# Patient Record
Sex: Male | Born: 1960 | Race: Black or African American | Hispanic: No
Health system: Midwestern US, Community
[De-identification: ages and names within clinical notes are randomized; demographics above are authoritative.]

## PROBLEM LIST (undated history)

## (undated) DIAGNOSIS — R0602 Shortness of breath: Secondary | ICD-10-CM

## (undated) DIAGNOSIS — R42 Dizziness and giddiness: Principal | ICD-10-CM

## (undated) DIAGNOSIS — M549 Dorsalgia, unspecified: Secondary | ICD-10-CM

## (undated) DIAGNOSIS — F329 Major depressive disorder, single episode, unspecified: Secondary | ICD-10-CM

## (undated) DIAGNOSIS — M25561 Pain in right knee: Secondary | ICD-10-CM

## (undated) DIAGNOSIS — G8929 Other chronic pain: Secondary | ICD-10-CM

## (undated) DIAGNOSIS — F32A Depression, unspecified: Secondary | ICD-10-CM

## (undated) DIAGNOSIS — F419 Anxiety disorder, unspecified: Secondary | ICD-10-CM

## (undated) DIAGNOSIS — F319 Bipolar disorder, unspecified: Secondary | ICD-10-CM

## (undated) HISTORY — PX: ANKLE SURGERY: SHX546

## (undated) HISTORY — PX: KNEE SURGERY: SHX244

---

## 2007-01-26 ENCOUNTER — Ambulatory Visit: Payer: Self-pay | Admitting: Family Medicine

## 2007-01-27 ENCOUNTER — Ambulatory Visit (HOSPITAL_COMMUNITY): Admission: RE | Admit: 2007-01-27 | Discharge: 2007-01-27 | Payer: Self-pay | Admitting: Family Medicine

## 2007-06-01 ENCOUNTER — Emergency Department (HOSPITAL_COMMUNITY): Admission: EM | Admit: 2007-06-01 | Discharge: 2007-06-01 | Payer: Self-pay | Admitting: Emergency Medicine

## 2007-06-01 ENCOUNTER — Encounter (INDEPENDENT_AMBULATORY_CARE_PROVIDER_SITE_OTHER): Payer: Self-pay | Admitting: Emergency Medicine

## 2007-06-01 ENCOUNTER — Ambulatory Visit: Payer: Self-pay | Admitting: Vascular Surgery

## 2007-07-21 ENCOUNTER — Ambulatory Visit: Payer: Self-pay | Admitting: Family Medicine

## 2007-07-21 LAB — CONVERTED CEMR LAB: GC Probe Amp, Urine: POSITIVE — AB

## 2010-07-05 ENCOUNTER — Other Ambulatory Visit: Payer: Self-pay | Admitting: Emergency Medicine

## 2010-07-06 ENCOUNTER — Inpatient Hospital Stay (HOSPITAL_COMMUNITY): Admission: AD | Admit: 2010-07-06 | Discharge: 2010-07-10 | Payer: Self-pay | Admitting: Psychiatry

## 2010-07-06 ENCOUNTER — Ambulatory Visit: Payer: Self-pay | Admitting: Psychiatry

## 2010-11-28 LAB — DIFFERENTIAL
Basophils Relative: 0 % (ref 0–1)
Lymphs Abs: 2 10*3/uL (ref 0.7–4.0)
Monocytes Absolute: 0.5 10*3/uL (ref 0.1–1.0)
Monocytes Relative: 7 % (ref 3–12)
Neutro Abs: 4.8 10*3/uL (ref 1.7–7.7)
Neutrophils Relative %: 65 % (ref 43–77)

## 2010-11-28 LAB — BASIC METABOLIC PANEL
BUN: 13 mg/dL (ref 6–23)
Chloride: 106 mEq/L (ref 96–112)
Creatinine, Ser: 1.08 mg/dL (ref 0.4–1.5)

## 2010-11-28 LAB — CBC
MCH: 32.4 pg (ref 26.0–34.0)
MCV: 93.5 fL (ref 78.0–100.0)
RDW: 14 % (ref 11.5–15.5)
WBC: 7.4 10*3/uL (ref 4.0–10.5)

## 2010-11-28 LAB — ETHANOL: Alcohol, Ethyl (B): <5 mg/dL (ref 0–10)

## 2010-11-28 LAB — RAPID URINE DRUG SCREEN, HOSP PERFORMED
Amphetamines: NOT DETECTED
Benzodiazepines: NOT DETECTED
Cocaine: NOT DETECTED

## 2011-06-27 LAB — CBC
HCT: 41.5
Hemoglobin: 14.1
MCHC: 34
MCV: 92.5
RBC: 4.49
RDW: 12.8

## 2011-06-27 LAB — DIFFERENTIAL
Basophils Absolute: 0
Basophils Relative: 0
Eosinophils Absolute: 0.1
Eosinophils Relative: 1
Monocytes Absolute: 0.6
Monocytes Relative: 10

## 2011-06-27 LAB — I-STAT 8, (EC8 V) (CONVERTED LAB)
BUN: 8
Bicarbonate: 26.1 — ABNORMAL HIGH
Glucose, Bld: 99
HCT: 42
Hemoglobin: 14.3
Operator id: 285841
Potassium: 3.9
Sodium: 140

## 2011-06-27 LAB — APTT: aPTT: 24

## 2015-08-25 ENCOUNTER — Emergency Department (HOSPITAL_COMMUNITY): Payer: Medicare Other

## 2015-08-25 ENCOUNTER — Emergency Department (HOSPITAL_COMMUNITY)
Admission: EM | Admit: 2015-08-25 | Discharge: 2015-08-25 | Disposition: A | Discharge status: Home / Self Care | Payer: Medicare Other | Attending: Emergency Medicine | Admitting: Emergency Medicine

## 2015-08-25 ENCOUNTER — Encounter (HOSPITAL_COMMUNITY): Payer: Self-pay | Admitting: *Deleted

## 2015-08-25 DIAGNOSIS — G8929 Other chronic pain: Secondary | ICD-10-CM | POA: Diagnosis not present

## 2015-08-25 DIAGNOSIS — Y9301 Activity, walking, marching and hiking: Secondary | ICD-10-CM | POA: Diagnosis not present

## 2015-08-25 DIAGNOSIS — Y9289 Other specified places as the place of occurrence of the external cause: Secondary | ICD-10-CM | POA: Insufficient documentation

## 2015-08-25 DIAGNOSIS — S79912A Unspecified injury of left hip, initial encounter: Secondary | ICD-10-CM | POA: Diagnosis present

## 2015-08-25 DIAGNOSIS — Y998 Other external cause status: Secondary | ICD-10-CM | POA: Insufficient documentation

## 2015-08-25 DIAGNOSIS — W010XXA Fall on same level from slipping, tripping and stumbling without subsequent striking against object, initial encounter: Secondary | ICD-10-CM | POA: Diagnosis not present

## 2015-08-25 HISTORY — DX: Other chronic pain: G89.29

## 2015-08-25 HISTORY — DX: Dorsalgia, unspecified: M54.9

## 2015-08-25 MED ORDER — ACETAMINOPHEN-CODEINE #3 300-30 MG PO TABS: 1.0000 | ORAL_TABLET | Freq: Four times a day (QID) | ORAL | Status: DC | PRN

## 2015-08-25 NOTE — ED Provider Notes (Signed)
CSN: [id number]     Arrival date & time 08/25/15  1153 History  By signing my name below, I, Lyndel SafeKaitlyn Shelton, attest that this documentation has been prepared under the direction and in the presence of Fayrene HelperBowie Tison Leibold, PA-C.  Electronically Signed: Lyndel SafeKaitlyn Shelton, ED Scribe. 08/25/2015. 12:29 PM.   Chief Complaint  Patient presents with  . Fall  . Hip Pain   The history is provided by the patient. No language interpreter was used.   HPI Comments: Luke Roberts is a 54 y.o. male, with a PMhx of chronic back pain, who presents to the Emergency Department complaining of sudden onset, constant, non-radiating, sharp, 8/10 left hip pain s/p mechanical fall that occurred 2 hours ago. He reports he was walking in soft grass when his cane became stuck in a hole and he fell onto his left hip. He was able to ambulate after the fall. Pt ambulates with a cane at baseline due to chronic back pain and denies worsening or new back pain s/p fall. He has not taken any alleviating medication PTA. Denies injury to left knee or left ankle, numbness or weakness. No overlying skin changes.   Past Medical History  Diagnosis Date  . Chronic back pain    History reviewed. No pertinent past surgical history. History reviewed. No pertinent family history. Social History  Substance Use Topics  . Smoking status: Unknown If Ever Smoked  . Smokeless tobacco: None  . Alcohol Use: No    Review of Systems  Musculoskeletal: Positive for arthralgias ( left hip). Negative for gait problem.  Skin: Negative for color change and wound.  Neurological: Negative for weakness and numbness.   Allergies  Review of patient's allergies indicates no known allergies.  Home Medications   Prior to Admission medications   Not on File   BP 138/96 mmHg  Pulse 95  Temp(Src) 98.1 F (36.7 C) (Oral)  Resp 18  SpO2 96% Physical Exam  Constitutional: He is oriented to person, place, and time. He appears well-developed and  well-nourished. No distress.  HENT:  Head: Normocephalic.  Eyes: Conjunctivae are normal.  Neck: Normal range of motion. Neck supple.  Cardiovascular: Normal rate.   Pulmonary/Chest: Effort normal. No respiratory distress.  Musculoskeletal: Normal range of motion. He exhibits tenderness.  Left hip; TTP noted to lateral hip at greater trochanter, no crepitus or step-offs, normal hip flexion and extension, increase in pain with hip abduction and adduction; strength and sensation intact; NVI.   Neurological: He is alert and oriented to person, place, and time. Coordination normal.  Skin: Skin is warm.  Psychiatric: He has a normal mood and affect. His behavior is normal.  Nursing note and vitals reviewed.   ED Course  Procedures  DIAGNOSTIC STUDIES: Oxygen Saturation is 96% on RA, normal by my interpretation.    COORDINATION OF CARE: 12:27 PM Discussed treatment plan with pt which includes to order Xray of left hip, although my suspicion for fracture is low. Pt acknowledges and agrees to plan.   Imaging Review Dg Hip Unilat With Pelvis 2-3 Views Left  08/25/2015  CLINICAL DATA:  Fall today. Left hip injury and pain. Initial encounter. EXAM: DG HIP (WITH OR WITHOUT PELVIS) 2-3V LEFT COMPARISON:  None. FINDINGS: No evidence of acute hip fracture or dislocation. Severe left hip osteoarthritis is seen, with prominent subchondral geode formation and flattening of the articular surface of the left femoral head. Mild to moderate right hip osteoarthritis noted. No pelvic fracture identified. IMPRESSION: No acute findings.  Severe left hip osteoarthritis. Electronically Signed   By: Myles Rosenthal M.D.   On: 08/25/2015 13:18   I have personally reviewed and evaluated these images as part of my medical decision-making.   MDM   Final diagnoses:  Hip injury, left, initial encounter    BP 138/96 mmHg  Pulse 95  Temp(Src) 98.1 F (36.7 C) (Oral)  Resp 18  SpO2 96%   I personally performed the  services described in this documentation, which was scribed in my presence. The recorded information has been reviewed and is accurate.      Fayrene Helper, PA-C 08/25/15 1325  Leta Baptist, MD 09/01/15 [phone]

## 2015-08-25 NOTE — ED Notes (Signed)
Pt reports walking with a cane due to chronic back pain. Pt tripped and fell, now has left hip pain. Pt ambulatory at triage.

## 2015-08-25 NOTE — Discharge Instructions (Signed)

## 2015-09-10 ENCOUNTER — Encounter (HOSPITAL_COMMUNITY): Payer: Self-pay | Admitting: Vascular Surgery

## 2015-09-10 ENCOUNTER — Emergency Department (HOSPITAL_COMMUNITY)
Admission: EM | Admit: 2015-09-10 | Discharge: 2015-09-11 | Disposition: A | Discharge status: Other Acute Inpatient Hospital | Payer: Medicare Other | Attending: Emergency Medicine | Admitting: Emergency Medicine

## 2015-09-10 DIAGNOSIS — G8929 Other chronic pain: Secondary | ICD-10-CM | POA: Insufficient documentation

## 2015-09-10 DIAGNOSIS — R45851 Suicidal ideations: Secondary | ICD-10-CM

## 2015-09-10 DIAGNOSIS — F32A Depression, unspecified: Secondary | ICD-10-CM

## 2015-09-10 DIAGNOSIS — F329 Major depressive disorder, single episode, unspecified: Secondary | ICD-10-CM | POA: Insufficient documentation

## 2015-09-10 HISTORY — DX: Bipolar disorder, unspecified: F31.9

## 2015-09-10 LAB — COMPREHENSIVE METABOLIC PANEL
ALBUMIN: 3.5 g/dL (ref 3.5–5.0)
ALK PHOS: 76 U/L (ref 38–126)
ALT: 19 U/L (ref 17–63)
AST: 24 U/L (ref 15–41)
Anion gap: 10 (ref 5–15)
BUN: 12 mg/dL (ref 6–20)
CALCIUM: 9.1 mg/dL (ref 8.9–10.3)
CHLORIDE: 107 mmol/L (ref 101–111)
CO2: 23 mmol/L (ref 22–32)
CREATININE: 0.76 mg/dL (ref 0.61–1.24)
GFR calc non Af Amer: >60 mL/min (ref >60)
GLUCOSE: 94 mg/dL (ref 65–99)
Potassium: 3.6 mmol/L (ref 3.5–5.1)
SODIUM: 140 mmol/L (ref 135–145)
Total Bilirubin: 0.5 mg/dL (ref 0.3–1.2)
Total Protein: 7.3 g/dL (ref 6.5–8.1)

## 2015-09-10 LAB — RAPID URINE DRUG SCREEN, HOSP PERFORMED
AMPHETAMINES: NOT DETECTED
BARBITURATES: NOT DETECTED
Benzodiazepines: NOT DETECTED
Cocaine: NOT DETECTED
OPIATES: NOT DETECTED
TETRAHYDROCANNABINOL: NOT DETECTED

## 2015-09-10 LAB — CBC
HEMATOCRIT: 37.2 % — AB (ref 39.0–52.0)
HEMOGLOBIN: 12.3 g/dL — AB (ref 13.0–17.0)
MCH: 30.2 pg (ref 26.0–34.0)
MCHC: 33.1 g/dL (ref 30.0–36.0)
MCV: 91.4 fL (ref 78.0–100.0)
Platelets: 329 10*3/uL (ref 150–400)
RBC: 4.07 MIL/uL — AB (ref 4.22–5.81)
RDW: 12.1 % (ref 11.5–15.5)
WBC: 5.6 10*3/uL (ref 4.0–10.5)

## 2015-09-10 LAB — SALICYLATE LEVEL: Salicylate Lvl: <4 mg/dL (ref 2.8–30.0)

## 2015-09-10 LAB — ETHANOL: Alcohol, Ethyl (B): 32 mg/dL — ABNORMAL HIGH (ref <5)

## 2015-09-10 LAB — ACETAMINOPHEN LEVEL: Acetaminophen (Tylenol), Serum: <10 ug/mL — ABNORMAL LOW (ref 10–30)

## 2015-09-10 NOTE — ED Notes (Signed)
Pt reports to the ED for eval of SI/HI. His plan would be to jump off of a bridge or beat someone to death with his cane. Pt reports he has been out of his medication x 3 weeks because he cannot get it filled. Pt reports these thoughts began today. Reports he is having command hallucinations and visual hallucinations. Pt denies any substance abuse. Pt A&Ox4, resp e/u, and skin warm and dry.

## 2015-09-10 NOTE — ED Notes (Signed)
Called staffing for sitter 

## 2015-09-11 ENCOUNTER — Inpatient Hospital Stay (HOSPITAL_COMMUNITY)
Admission: AD | Admit: 2015-09-11 | Discharge: 2015-09-15 | DRG: 885 | Disposition: A | Discharge status: Home / Self Care | Payer: Medicare Other | Source: Intra-hospital | Attending: Psychiatry | Admitting: Psychiatry

## 2015-09-11 ENCOUNTER — Encounter (HOSPITAL_COMMUNITY): Payer: Self-pay | Admitting: *Deleted

## 2015-09-11 DIAGNOSIS — F319 Bipolar disorder, unspecified: Secondary | ICD-10-CM | POA: Diagnosis present

## 2015-09-11 DIAGNOSIS — F314 Bipolar disorder, current episode depressed, severe, without psychotic features: Secondary | ICD-10-CM | POA: Diagnosis not present

## 2015-09-11 DIAGNOSIS — Z23 Encounter for immunization: Secondary | ICD-10-CM | POA: Insufficient documentation

## 2015-09-11 DIAGNOSIS — F17203 Nicotine dependence unspecified, with withdrawal: Secondary | ICD-10-CM | POA: Diagnosis present

## 2015-09-11 DIAGNOSIS — R45851 Suicidal ideations: Secondary | ICD-10-CM | POA: Diagnosis present

## 2015-09-11 DIAGNOSIS — F329 Major depressive disorder, single episode, unspecified: Secondary | ICD-10-CM | POA: Diagnosis not present

## 2015-09-11 HISTORY — DX: Major depressive disorder, single episode, unspecified: F32.9

## 2015-09-11 HISTORY — DX: Anxiety disorder, unspecified: F41.9

## 2015-09-11 HISTORY — DX: Depression, unspecified: F32.A

## 2015-09-11 MED ORDER — HYDROXYZINE HCL 25 MG PO TABS
25.0000 mg | ORAL_TABLET | Freq: Four times a day (QID) | ORAL | Status: DC | PRN
  Administered 2015-09-11 – 2015-09-14 (×3): 25 mg via ORAL
  Filled 2015-09-11 (×4): qty 1

## 2015-09-11 MED ORDER — NICOTINE 21 MG/24HR TD PT24
21.0000 mg | MEDICATED_PATCH | Freq: Every day | TRANSDERMAL | Status: DC
  Administered 2015-09-11 – 2015-09-13 (×3): 21 mg via TRANSDERMAL
  Filled 2015-09-11 (×7): qty 1

## 2015-09-11 MED ORDER — ACETAMINOPHEN 325 MG PO TABS
650.0000 mg | ORAL_TABLET | Freq: Four times a day (QID) | ORAL | Status: DC | PRN
  Administered 2015-09-11 – 2015-09-12 (×2): 650 mg via ORAL
  Filled 2015-09-11 (×2): qty 2

## 2015-09-11 MED ORDER — QUETIAPINE FUMARATE 100 MG PO TABS
100.0000 mg | ORAL_TABLET | Freq: Every day | ORAL | Status: DC
  Administered 2015-09-11 – 2015-09-14 (×4): 100 mg via ORAL
  Filled 2015-09-11 (×7): qty 1

## 2015-09-11 MED ORDER — PNEUMOCOCCAL VAC POLYVALENT 25 MCG/0.5ML IJ INJ
0.5000 mL | INJECTION | INTRAMUSCULAR | Status: AC
  Administered 2015-09-12: 0.5 mL via INTRAMUSCULAR

## 2015-09-11 MED ORDER — ALUM & MAG HYDROXIDE-SIMETH 200-200-20 MG/5ML PO SUSP: 30.0000 mL | ORAL | Status: DC | PRN

## 2015-09-11 MED ORDER — MAGNESIUM HYDROXIDE 400 MG/5ML PO SUSP: 30.0000 mL | Freq: Every day | ORAL | Status: DC | PRN

## 2015-09-11 MED ORDER — DIVALPROEX SODIUM ER 500 MG PO TB24
500.0000 mg | ORAL_TABLET | Freq: Every day | ORAL | Status: DC
  Administered 2015-09-11 – 2015-09-13 (×3): 500 mg via ORAL
  Filled 2015-09-11 (×6): qty 1

## 2015-09-11 NOTE — ED Notes (Signed)
MD at bedside. 

## 2015-09-11 NOTE — H&P (Signed)
Psychiatric Admission Assessment Adult  Patient Identification: Luke Roberts MRN:  [id number] Date of Evaluation:  09/11/2015 Chief Complaint:   " I felt life was not worth living anymore " Principal Diagnosis: Bipolar 1 disorder, depressed, severe (Luke) Diagnosis:   Patient Active Problem List   Diagnosis Date Noted  . Bipolar 1 disorder, depressed, severe ([name]) [F31.4] 09/11/2015   History of Present Illness: 54 year old man.  Reports he has been diagnosed with Bipolar Disorder in the past . States he has not been taking his prescribed medications for several weeks " because I could not afford them and I could not get anybody to help me ". States he became increasingly frustrated because he was " going to all the places which they say will help you, and I was just getting the run around, they don't really help".  States he started feeling more depressed over recent days to weeks, made worse by holiday season, which has intensified his sense of loneliness . He reports he felt " as if life was not worth living anymore".  He states he was thinking of jumping off a bridge . He also developed vague HI towards " whoever is running [employer] ", which he attributes to the frustration he felt about not being helped . States a passerby stopped and asked him if he was OK and on finding out patient had some suicidal ideations, brought him to hospital ( yesterday)   Associated Signs/Symptoms: Depression Symptoms:  depressed mood, anhedonia, insomnia, recurrent thoughts of death, suicidal thoughts with specific plan, loss of energy/fatigue, decreased appetite,  Has lost 10 lbs.  (Hypo) Manic Symptoms:  At this time does not endorse symptoms of mania or hypomania . Anxiety Symptoms:  Some free floating anxiety endorsed  Psychotic Symptoms:  States he was having fleeting hallucinations recently, states " I felt I could hear the voice of a deceased friend talk to me" recently. At this time does  not endorse current psychotic symptoms. PTSD Symptoms: Does not endorse  Total Time spent with patient: 45 minutes  Past Psychiatric History: Reports he has been diagnosed with Bipolar Disorder, and had been on Depakote ER and Seroquel up to a few weeks ago, when he ran out / could not afford refilling . Has had previous psychiatric admissions- x 3 in the past, most recent admission 5 years ago. States he has had prior hallucinations in the context of " being off my medicines ".  History of suicide attempt by cutting wrist 10 years ago. Describes some agoraphobia, avoids public or crowded places . Endorses periods of increased irritability, decreased need for sleep, but does not describe any clear periods of mania. States he had been getting psychiatric medications via her PCP, and also had been referred to [facility] in [place] to Self: Is patient at risk for suicide?: Yes (contracts for safety, stated thoughts have decreased) Risk to Others:   Prior Inpatient Therapy:   Prior Outpatient Therapy:    Alcohol Screening: 1. How often do you have a drink containing alcohol?: 2 to 3 times a week 2. How many drinks containing alcohol do you have on a typical day when you are drinking?: 3 or 4 3. How often do you have six or more drinks on one occasion?: Never Preliminary Score: 1 4. How often during the last year have you found that you were not able to stop drinking once you had started?: Never 5. How often during the last year have  you failed to do what was normally expected from you becasue of drinking?: Never 6. How often during the last year have you needed a first drink in the morning to get yourself going after a heavy drinking session?: Never 7. How often during the last year have you had a feeling of guilt of remorse after drinking?: Never 8. How often during the last year have you been unable to remember what happened the night before because you had been drinking?: Never 9. Have you  or someone else been injured as a result of your drinking?: No 10. Has a relative or friend or a doctor or another health worker been concerned about your drinking or suggested you cut down?: No Alcohol Use Disorder Identification Test Final Score (AUDIT): 4 Brief Intervention: Yes Substance Abuse History in the last 12 months:  Denies drug abuse, denies alcohol abuse, states he drinks about 2-3 beers once a week.  Consequences of Substance Abuse: Denies  Previous Psychotropic Medications:  Depakote ER and Seroquel  For 6-7 years " on and off, but mostly on".  States " those are the two that work for me ". Also remembers having been on Wellbutrin .   Psychological Evaluations: No  Past Medical History: denies any medical issues, uses chewing tobacco, denies smoking, NKDA. Past Medical History  Diagnosis Date  . Chronic back pain   . Bipolar 1 disorder ([name])   . Anxiety   . Depression     Past Surgical History  Procedure Laterality Date  . Knee surgery Left   . Ankle surgery Right    Family History: parents are both deceased, father died from MI, 30 + years ago, mother died from 45, Nov 17, 2002, one brother died 56 years ago from MI.  No surviving siblings. Family Psychiatric  History:  Denies any psychiatric family history, no suicides in family  Social History: currently homeless , divorced, no children, on disability,  recently released from jail for " misdemeanor larceny".   History  Alcohol Use  . 1.2 oz/week  . 2 Cans of beer per week    Comment: stated he usually drinks couple beers weekly     History  Drug Use No    Comment: denied all drug use    Social History   Social History  . Marital Status: Single    Spouse Name: N/A  . Number of Children: N/A  . Years of Education: N/A   Social History Main Topics  . Smoking status: Never Smoker   . Smokeless tobacco: Current User    Types: Snuff  . Alcohol Use: 1.2 oz/week    2 Cans of beer per week     Comment: stated  he usually drinks couple beers weekly  . Drug Use: No     Comment: denied all drug use  . Sexual Activity: No   Other Topics Concern  . None   Social History Narrative   Additional Social History:    Pain Medications: tylenol Prescriptions: no prescription medication Over the Counter: tylenol History of alcohol / drug use?: Yes Longest period of sobriety (when/how long): week Negative Consequences of Use: Financial, Work / School Withdrawal Symptoms: Other (Comment) (anxiety)  Allergies:  No Known Allergies Lab Results:  Results for orders placed or performed during the hospital encounter of 09/10/15 (from the past 48 hour(s))  Comprehensive metabolic panel     Status: None   Collection Time: 09/10/15  9:41 PM  Result Value Ref Range   Sodium 140  135 - 145 mmol/L   Potassium 3.6 3.5 - 5.1 mmol/L   Chloride 107 101 - 111 mmol/L   CO2 23 22 - 32 mmol/L   Glucose, Bld 94 65 - 99 mg/dL   BUN 12 6 - 20 mg/dL   Creatinine, Ser 0.76 0.61 - 1.24 mg/dL   Calcium 9.1 8.9 - 10.3 mg/dL   Total Protein 7.3 6.5 - 8.1 g/dL   Albumin 3.5 3.5 - 5.0 g/dL   AST 24 15 - 41 U/L   ALT 19 17 - 63 U/L   Alkaline Phosphatase 76 38 - 126 U/L   Total Bilirubin 0.5 0.3 - 1.2 mg/dL   GFR calc non Af Amer >60 >60 mL/min   GFR calc Af Amer >60 >60 mL/min    Comment: (NOTE) The eGFR has been calculated using the CKD EPI equation. This calculation has not been validated in all clinical situations. eGFR's persistently <60 mL/min signify possible Chronic Kidney Disease.    Anion gap 10 5 - 15  Ethanol (ETOH)     Status: Abnormal   Collection Time: 09/10/15  9:41 PM  Result Value Ref Range   Alcohol, Ethyl (B) 32 (H) <5 mg/dL    Comment:        LOWEST DETECTABLE LIMIT FOR SERUM ALCOHOL IS 5 mg/dL FOR MEDICAL PURPOSES ONLY   Salicylate level     Status: None   Collection Time: 09/10/15  9:41 PM  Result Value Ref Range   Salicylate Lvl <3.2 2.8 - 30.0 mg/dL  Acetaminophen level     Status:  Abnormal   Collection Time: 09/10/15  9:41 PM  Result Value Ref Range   Acetaminophen (Tylenol), Serum <10 (L) 10 - 30 ug/mL    Comment:        THERAPEUTIC CONCENTRATIONS VARY SIGNIFICANTLY. A RANGE OF 10-30 ug/mL MAY BE AN EFFECTIVE CONCENTRATION FOR MANY PATIENTS. HOWEVER, SOME ARE BEST TREATED AT CONCENTRATIONS OUTSIDE THIS RANGE. ACETAMINOPHEN CONCENTRATIONS >150 ug/mL AT 4 HOURS AFTER INGESTION AND >50 ug/mL AT 12 HOURS AFTER INGESTION ARE OFTEN ASSOCIATED WITH TOXIC REACTIONS.   CBC     Status: Abnormal   Collection Time: 09/10/15  9:41 PM  Result Value Ref Range   WBC 5.6 4.0 - 10.5 K/uL   RBC 4.07 (L) 4.22 - 5.81 MIL/uL   Hemoglobin 12.3 (L) 13.0 - 17.0 g/dL   HCT 37.2 (L) 39.0 - 52.0 %   MCV 91.4 78.0 - 100.0 fL   MCH 30.2 26.0 - 34.0 pg   MCHC 33.1 30.0 - 36.0 g/dL   RDW 12.1 11.5 - 15.5 %   Platelets 329 150 - 400 K/uL  Urine rapid drug screen (hosp performed) (Not at [facility])     Status: None   Collection Time: 09/10/15  9:43 PM  Result Value Ref Range   Opiates NONE DETECTED NONE DETECTED   Cocaine NONE DETECTED NONE DETECTED   Benzodiazepines NONE DETECTED NONE DETECTED   Amphetamines NONE DETECTED NONE DETECTED   Tetrahydrocannabinol NONE DETECTED NONE DETECTED   Barbiturates NONE DETECTED NONE DETECTED    Comment:        DRUG SCREEN FOR MEDICAL PURPOSES ONLY.  IF CONFIRMATION IS NEEDED FOR ANY PURPOSE, NOTIFY LAB WITHIN 5 DAYS.        LOWEST DETECTABLE LIMITS FOR URINE DRUG SCREEN Drug Class       Cutoff (ng/mL) Amphetamine      1000 Barbiturate      200 Benzodiazepine   122 Tricyclics  300 Opiates          300 Cocaine          300 THC              50     Metabolic Disorder Labs:  No results found for: HGBA1C, MPG No results found for: PROLACTIN No results found for: CHOL, TRIG, HDL, CHOLHDL, VLDL, LDLCALC  Current Medications: Current Facility-Administered Medications  Medication Dose Route Frequency Provider Last Rate Last Dose  .  acetaminophen (TYLENOL) tablet 650 mg  650 mg Oral Q6H PRN Harriet Butte, NP      . alum & mag hydroxide-simeth (MAALOX/MYLANTA) 200-200-20 MG/5ML suspension 30 mL  30 mL Oral Q4H PRN Harriet Butte, NP      . hydrOXYzine (ATARAX/VISTARIL) tablet 25 mg  25 mg Oral Q6H PRN Harriet Butte, NP      . magnesium hydroxide (MILK OF MAGNESIA) suspension 30 mL  30 mL Oral Daily PRN Harriet Butte, NP      . nicotine (NICODERM CQ - dosed in mg/24 hours) patch 21 mg  21 mg Transdermal Daily Jenne Campus, MD      . Derrill Memo ON 09/12/2015] pneumococcal 23 valent vaccine (PNU-IMMUNE) injection 0.5 mL  0.5 mL Intramuscular Tomorrow-1000 Jenne Campus, MD       PTA Medications: Prescriptions prior to admission  Medication Sig Dispense Refill Last Dose  . acetaminophen-codeine (TYLENOL #3) 300-30 MG tablet Take 1-2 tablets by mouth every 6 (six) hours as needed for moderate pain. 15 tablet 0     Musculoskeletal: Strength & Muscle Tone: within normal limits- no tremors, no diaphoresis, no symptoms of alcohol WDL .  Gait & Station: normal- walks with cane due to bilateral knee and L hip pain Patient leans: N/A  Psychiatric Specialty Exam: Physical Exam  Review of Systems  Constitutional: Positive for weight loss.  HENT: Negative.   Respiratory: Negative.   Cardiovascular: Negative.   Gastrointestinal: Negative.   Genitourinary: Negative.   Musculoskeletal: Positive for joint pain.       Knee pain, bilateral Walks with cane     Skin: Negative.   Neurological: Negative for seizures.  Endo/Heme/Allergies: Negative.   Psychiatric/Behavioral: Positive for depression.    Blood pressure 116/69, pulse 78, temperature 98.6 F (37 C), temperature source Oral, resp. rate 18, height 6' 2"  (1.88 m), weight 198 lb (89.812 kg), SpO2 97 %.Body mass index is 25.41 kg/(m^2).  General Appearance: Fairly Groomed  Engineer, water::  Fair  Speech:  Normal Rate  Volume:  Normal  Mood:  Depressed  Affect:   Constricted  Thought Process:  Goal Directed and Linear  Orientation:  Full (Time, Place, and Person)  Thought Content:  reports occasional auditory hallucinations, none today, does not appear internally preoccupied, no delusions expressed   Suicidal Thoughts:  at this time denies any suicidal ideations, self injurious ideations and contracts for safety on unit   Homicidal Thoughts:  States he has had some HI towards [employer]- does not know who this is , but today states " I really have no intention of doing anything"  Memory:  recent and remote grossly intact  Judgement:  Fair  Insight:  Fair  Psychomotor Activity:  Normal- no tremors, no diaphoresis.  Concentration:  Good  Recall:  Good  Fund of Knowledge:Good  Language: Good  Akathisia:  Negative  Handed:  Right  AIMS (if indicated):     Assets:  Communication Skills Desire for  Improvement Resilience  ADL's:  Intact  Cognition: WNL  Sleep:        Treatment Plan Summary: Daily contact with patient to assess and evaluate symptoms and progress in treatment, Medication management, Plan inpatient admission and medications as below   Observation Level/Precautions:  15 minute checks  Laboratory:  HgbA1C, Lipid Panel, TSH   Psychotherapy:  Milieu, support    Medications:  Restart Depakote ER 500 mgrs QHS, restart Seroquel 100 mgrs QHS . Vistaril PRNs as needed for anxiety  Consultations:  As needed   Discharge Concerns:  Homelessness   Estimated LOS: 5-6 days   Other:     I certify that inpatient services furnished can reasonably be expected to improve the patient's condition.   COBOS, FERNANDO 12/26/20162:28 PM

## 2015-09-11 NOTE — ED Notes (Signed)
TTS in process 

## 2015-09-11 NOTE — Plan of Care (Signed)
Problem: Consults Goal: Depression Patient Education See Patient Education Module for education specifics.  Outcome: Progressing Nurse discussed depression/coping skills with patient.        

## 2015-09-11 NOTE — Progress Notes (Signed)
D: Pt SI-no plan but contracts for safety. denies HI/AVH. Pt is pleasant and cooperative. Pt feels the same, since he came in today. Pt optimistic about getting a place to live and help with getting his prescriptions filled.   A: Pt was offered support and encouragement. Pt was given scheduled medications. Pt was encourage to attend groups. Q 15 minute checks were done for safety.   R:Pt attends groups and interacts well with peers and staff. Pt is taking medication. Pt has no complaints at this time .Pt receptive to treatment and safety maintained on unit.

## 2015-09-11 NOTE — Tx Team (Signed)
Initial Interdisciplinary Treatment Plan   PATIENT STRESSORS: Financial difficulties Health problems Marital or family conflict Substance abuse   PATIENT STRENGTHS: Ability for insight Average or above average intelligence Capable of independent living Communication skills General fund of knowledge Motivation for treatment/growth Supportive family/friends   PROBLEM LIST: Problem List/Patient Goals Date to be addressed Date deferred Reason deferred Estimated date of resolution  anxiety 09/11/2015   D/c  depression 09/11/2015   D/c  Suicidal/homicidal thoughts 09/11/2015   D/c  Substance abuse 09/11/2015   D/c                                 DISCHARGE CRITERIA:  Ability to meet basic life and health needs Adequate post-discharge living arrangements Improved stabilization in mood, thinking, and/or behavior Medical problems require only outpatient monitoring Motivation to continue treatment in a less acute level of care Need for constant or close observation no longer present Reduction of life-threatening or endangering symptoms to within safe limits Safe-care adequate arrangements made Verbal commitment to aftercare and medication compliance Withdrawal symptoms are absent or subacute and managed without 24-hour nursing intervention  PRELIMINARY DISCHARGE PLAN: Attend aftercare/continuing care group Attend PHP/IOP Attend 12-step recovery group Outpatient therapy Return to previous living arrangement  PATIENT/FAMIILY INVOLVEMENT: This treatment plan has been presented to and reviewed with the patient, Luke Roberts.  The patient and family have been given the opportunity to ask questions and make suggestions.  Quintella ReichertKnight, Coyle Stordahl [place] 09/11/2015, 2:04 PM
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
## 2015-09-11 NOTE — Progress Notes (Signed)
Adult Psychoeducational Group Note  Date:  09/11/2015 Time:  11:17 PM  Group Topic/Focus:  Wrap-Up Group:   The focus of this group is to help patients review their daily goal of treatment and discuss progress on daily workbooks.  Participation Level:  Active  Participation Quality:  Appropriate and Attentive  Affect:  Appropriate  Cognitive:  Alert, Appropriate and Oriented  Insight: Appropriate  Engagement in Group:  Engaged  Modes of Intervention:  Discussion and Education  Additional Comments:  Pt attended and participated in group.  Pt stated that he just got here today but has had a good day since arriving.    Berlin Hunuttle, Yasmin Bronaugh M 09/11/2015, 11:17 PM

## 2015-09-11 NOTE — ED Provider Notes (Signed)
Pt alert, content, nad.  Filed Vitals:   09/11/15 0617 09/11/15 0906  BP: 139/90 126/78  Pulse: 71 78  Temp: 97.9 F (36.6 C) 98.6 F (37 C)  Resp: 18 18    [facility], Dr Jama Flavorsobos, accepting doctor.  Pt currently appears stable for transfer.     Cathren LaineKevin Murline Weigel, MD 09/11/15 [id number]

## 2015-09-11 NOTE — Discharge Instructions (Signed)
Transfer to [facility] 

## 2015-09-11 NOTE — ED Provider Notes (Signed)
CSN: [id number]     Arrival date & time 09/10/15  2128 History  By signing my name below, I, Luke Roberts, attest that this documentation has been prepared under the direction and in the presence of Tomasita CrumbleAdeleke Baila Rouse, MD. Electronically Signed: Bethel BornBritney Roberts, ED Scribe. 09/11/2015. 1:24 AM    Chief Complaint  Patient presents with  . Suicidal  . Homicidal   The history is provided by the patient. No language interpreter was used.   Luke Roberts is a 54 y.o. male with history of bipolar disorder who presents to the Emergency Department complaining of SI/HI with onset yesterday. He reports a plan to jump off a bridge. Pt states that his symptoms worsened last night near 7 PM. He has been out of his medication for 3 weeks. Pt states that he has been on his medication for 6 years and it usually alleviates his symptoms. Associated symptoms include hallucinations, he states that he was having a conversation with a friend who is deceased earlier. Pt denies fever, runny nose, abdominal pain, nausea, vomiting, diarrhea, and chest pain.   Past Medical History  Diagnosis Date  . Chronic back pain   . Bipolar 1 disorder [facility](HCC)    Past Surgical History  Procedure Laterality Date  . Knee surgery Left   . Ankle surgery Right    No family history on file. Social History  Substance Use Topics  . Smoking status: Never Smoker   . Smokeless tobacco: Current User  . Alcohol Use: Yes     Comment: drank a little today, no drinking previously    Review of Systems 10 Systems reviewed and all are negative for acute change except as noted in the HPI. Allergies  Review of patient's allergies indicates no known allergies.  Home Medications   Prior to Admission medications   Medication Sig Start Date End Date Taking? Authorizing Provider  acetaminophen-codeine (TYLENOL #3) 300-30 MG tablet Take 1-2 tablets by mouth every 6 (six) hours as needed for moderate pain. 08/25/15   Fayrene HelperBowie Tran, PA-C   BP 142/88  mmHg  Pulse 85  Temp(Src) 97.8 F (36.6 C) (Oral)  Resp 16  SpO2 94% Physical Exam  Constitutional: He is oriented to person, place, and time. Vital signs are normal. He appears well-developed and well-nourished.  Non-toxic appearance. He does not appear ill. No distress.  HENT:  Head: Normocephalic and atraumatic.  Nose: Nose normal.  Mouth/Throat: Oropharynx is clear and moist. No oropharyngeal exudate.  Eyes: Conjunctivae and EOM are normal. Pupils are equal, round, and reactive to light. No scleral icterus.  Neck: Normal range of motion. Neck supple. No tracheal deviation, no edema, no erythema and normal range of motion present. No thyroid mass and no thyromegaly present.  Cardiovascular: Normal rate, regular rhythm, S1 normal, S2 normal, normal heart sounds, intact distal pulses and normal pulses.  Exam reveals no gallop and no friction rub.   No murmur heard. Pulmonary/Chest: Effort normal and breath sounds normal. No respiratory distress. He has no wheezes. He has no rhonchi. He has no rales.  Abdominal: Soft. Normal appearance and bowel sounds are normal. He exhibits no distension, no ascites and no mass. There is no hepatosplenomegaly. There is no tenderness. There is no rebound, no guarding and no CVA tenderness.  Musculoskeletal: Normal range of motion. He exhibits no edema or tenderness.  Lymphadenopathy:    He has no cervical adenopathy.  Neurological: He is alert and oriented to person, place, and time. He has normal strength. No  cranial nerve deficit or sensory deficit.  Skin: Skin is warm, dry and intact. No petechiae and no rash noted. He is not diaphoretic. No erythema. No pallor.  Psychiatric: He has a normal mood and affect. His behavior is normal. Judgment normal. He expresses suicidal ideation.  Nursing note and vitals reviewed.   ED Course  Procedures (including critical care time) DIAGNOSTIC STUDIES: Oxygen Saturation is 94% on RA, adequate by my interpretation.     COORDINATION OF CARE: 1:20 AM Discussed treatment plan which includes lab work and TTS consult with pt at bedside and pt agreed to plan.  Labs Review Labs Reviewed  ETHANOL - Abnormal; Notable for the following:    Alcohol, Ethyl (B) 32 (*)    All other components within normal limits  ACETAMINOPHEN LEVEL - Abnormal; Notable for the following:    Acetaminophen (Tylenol), Serum <10 (*)    All other components within normal limits  CBC - Abnormal; Notable for the following:    RBC 4.07 (*)    Hemoglobin 12.3 (*)    HCT 37.2 (*)    All other components within normal limits  COMPREHENSIVE METABOLIC PANEL  SALICYLATE LEVEL  URINE RAPID DRUG SCREEN, HOSP PERFORMED    Imaging Review No results found. I have personally reviewed and evaluated these lab results as part of my medical decision-making.   EKG Interpretation None      MDM   Final diagnoses:  None   Patient presents to the ED for SI.  He is medically clear after history and exam. Will consult TTS for evaluation.   I personally performed the services described in this documentation, which was scribed in my presence. The recorded information has been reviewed and is accurate.      Tomasita Crumble, MD 09/11/15 [phone]

## 2015-09-11 NOTE — ED Notes (Signed)
[facility]

## 2015-09-11 NOTE — [facility] Suicide Risk Assessment (Signed)
[facility]   Nursing information obtained from:  Patient Demographic factors:  Male, Divorced or widowed, Low socioeconomic status, Living alone, Unemployed Current Mental Status:  Suicidal ideation indicated by patient, Thoughts of violence towards others Loss Factors:  Financial problems / change in socioeconomic status Historical Factors:  NA Risk Reduction Factors:  Living with another person, especially a relative Total Time spent with patient: 45 minutes Principal Problem: Bipolar 1 disorder, depressed, severe (HCC) Diagnosis:   Patient Active Problem List   Diagnosis Date Noted  . Bipolar 1 disorder, depressed, severe (HCC) [F31.4] 09/11/2015     Continued Clinical Symptoms:  Alcohol Use Disorder Identification Test Final Score (AUDIT): 4 The "Alcohol Use Disorders Identification Test", Guidelines for Use in Primary Care, Second Edition.  World Science writerHealth Organization [facility](WHO). Score between 0-7:  no or low risk or alcohol related problems. Score between 8-15:  moderate risk of alcohol related problems. Score between 16-19:  high risk of alcohol related problems. Score 20 or above:  warrants further diagnostic evaluation for alcohol dependence and treatment.   CLINICAL FACTORS:  54 year old male, reports history of bipolar disorder, presents with depression, in the context of medication non compliance x several weeks . States Seroquel/Depakote ER combination has worked best for him.    Psychiatric Specialty Exam: Physical Exam  ROS  Blood pressure 116/69, pulse 78, temperature 98.6 F (37 C), temperature source Oral, resp. rate 18, height 6\' 2"  (1.88 m), weight 198 lb (89.812 kg), SpO2 97 %.Body mass index is 25.41 kg/(m^2).   see admit note MSE                                                        COGNITIVE FEATURES THAT CONTRIBUTE TO RISK:  Closed-mindedness and Loss of executive function    SUICIDE RISK:   Moderate:   Frequent suicidal ideation with limited intensity, and duration, some specificity in terms of plans, no associated intent, good self-control, limited dysphoria/symptomatology, some risk factors present, and identifiable protective factors, including available and accessible social support.  PLAN OF CARE: Patient will be admitted to inpatient psychiatric unit for stabilization and safety. Will provide and encourage milieu participation. Provide medication management and maked adjustments as needed.  Will follow daily.    Medical Decision Making:  Review of Psycho-Social Stressors (1), Review or order clinical lab tests (1), Established Problem, Worsening (2) and Review of New Medication or Change in Dosage (2)  I certify that inpatient services furnished can reasonably be expected to improve the patient's condition.   Bernise Sylvain 09/11/2015, 3:03 PM

## 2015-09-11 NOTE — Plan of Care (Signed)
Problem: Ineffective individual coping Goal: STG: Patient will remain free from self harm Outcome: Progressing Pt safe on the unit at this time     

## 2015-09-11 NOTE — ED Notes (Signed)
[place] here awaiting EMTELA.
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
## 2015-09-11 NOTE — ED Provider Notes (Signed)
9:15 AM patient is alert ambulatory, offers no complaint. Pleasant cooperative. Walks with limp favoring right leg which she states is chronic for approximately the past 5 years. Results for orders placed or performed during the hospital encounter of 09/10/15  Comprehensive metabolic panel  Result Value Ref Range   Sodium 140 135 - 145 mmol/L   Potassium 3.6 3.5 - 5.1 mmol/L   Chloride 107 101 - 111 mmol/L   CO2 23 22 - 32 mmol/L   Glucose, Bld 94 65 - 99 mg/dL   BUN 12 6 - 20 mg/dL   Creatinine, Ser 2.870.76 0.61 - 1.24 mg/dL   Calcium 9.1 8.9 - 86.710.3 mg/dL   Total Protein 7.3 6.5 - 8.1 g/dL   Albumin 3.5 3.5 - 5.0 g/dL   AST 24 15 - 41 U/L   ALT 19 17 - 63 U/L   Alkaline Phosphatase 76 38 - 126 U/L   Total Bilirubin 0.5 0.3 - 1.2 mg/dL   GFR calc non Af Amer >60 >60 mL/min   GFR calc Af Amer >60 >60 mL/min   Anion gap 10 5 - 15  Ethanol (ETOH)  Result Value Ref Range   Alcohol, Ethyl (B) 32 (H) <5 mg/dL  Salicylate level  Result Value Ref Range   Salicylate Lvl <4.0 2.8 - 30.0 mg/dL  Acetaminophen level  Result Value Ref Range   Acetaminophen (Tylenol), Serum <10 (L) 10 - 30 ug/mL  CBC  Result Value Ref Range   WBC 5.6 4.0 - 10.5 K/uL   RBC 4.07 (L) 4.22 - 5.81 MIL/uL   Hemoglobin 12.3 (L) 13.0 - 17.0 g/dL   HCT 67.237.2 (L) 09.439.0 - 70.952.0 %   MCV 91.4 78.0 - 100.0 fL   MCH 30.2 26.0 - 34.0 pg   MCHC 33.1 30.0 - 36.0 g/dL   RDW 62.812.1 36.611.5 - 29.415.5 %   Platelets 329 150 - 400 K/uL  Urine rapid drug screen (hosp performed) (Not at [facility])  Result Value Ref Range   Opiates NONE DETECTED NONE DETECTED   Cocaine NONE DETECTED NONE DETECTED   Benzodiazepines NONE DETECTED NONE DETECTED   Amphetamines NONE DETECTED NONE DETECTED   Tetrahydrocannabinol NONE DETECTED NONE DETECTED   Barbiturates NONE DETECTED NONE DETECTED   Dg Hip Unilat With Pelvis 2-3 Views Left  08/25/2015  CLINICAL DATA:  Fall today. Left hip injury and pain. Initial encounter. EXAM: DG HIP (WITH OR WITHOUT PELVIS)  2-3V LEFT COMPARISON:  None. FINDINGS: No evidence of acute hip fracture or dislocation. Severe left hip osteoarthritis is seen, with prominent subchondral geode formation and flattening of the articular surface of the left femoral head. Mild to moderate right hip osteoarthritis noted. No pelvic fracture identified. IMPRESSION: No acute findings.  Severe left hip osteoarthritis. Electronically Signed   By: Myles RosenthalJohn  Stahl M.D.   On: 08/25/2015 13:18   [facility] .   Doug SouSam Lataya Varnell, MD 09/11/15 [phone]

## 2015-09-12 DIAGNOSIS — F314 Bipolar disorder, current episode depressed, severe, without psychotic features: Secondary | ICD-10-CM | POA: Diagnosis not present

## 2015-09-12 LAB — TSH: TSH: 2.324 u[IU]/mL (ref 0.350–4.500)

## 2015-09-12 LAB — LIPID PANEL
CHOLESTEROL: 157 mg/dL (ref 0–200)
HDL: 64 mg/dL (ref >40)
LDL Cholesterol: 76 mg/dL (ref 0–99)
Total CHOL/HDL Ratio: 2.5 RATIO
Triglycerides: 87 mg/dL (ref <150)
VLDL: 17 mg/dL (ref 0–40)

## 2015-09-12 NOTE — Plan of Care (Signed)
Problem: Diagnosis: Increased Risk For Suicide Attempt Goal: STG-Patient Will Comply With Medication Regime Outcome: Progressing Luke Roberts is compliant with medication regimen

## 2015-09-12 NOTE — Progress Notes (Signed)
Patient ID: Luke Roberts, male   DOB: Nov 19, 1960, 54 y.o.   MRN: [id number] D: Patient stated he is doing well and tolerating medication. Pt reports decreased anxiety and depressive symptoms, rated depression as 6 from 10 on admission. Pt denies SI/HI/AVH. Pt thought process is organized and behavior is appropriate. Pt attended evening wrap up group and engaged in discussion.  Cooperative with assessment.   A: Met with pt 1:1. Medications administered as prescribed. Writer encouraged pt to discuss feelings. Pt encouraged to come to staff with any questions or concerns.   R: Patient is safe on the unit and compliant with medication.

## 2015-09-12 NOTE — Progress Notes (Signed)
Patient ID: Luke Roberts, male   DOB: 1961/01/31, 54 y.o.   MRN: [id number] Per State regulations 482.30 this chart was reviewed for medical necessity with respect to the patient's admission/duration of stay.    Next review date: 09/15/15  Thurman CoyerEric Janeshia Ciliberto, BSN, RN-BC  Case Manager

## 2015-09-12 NOTE — [facility] Group Notes (Signed)
[facility] Group Notes:  (Nursing/MHT/Case Management/Adjunct)  Date:  09/12/2015  Time:  11:11 AM  Type of Therapy:  Psychoeducational Skills  Participation Level:  Did Not Attend  Participation Quality:  N/A  Affect:  N/A   Cognitive:  N/A  Insight:  None  Engagement in Group:  None  Modes of Intervention:  Discussion and Education  Summary of Progress/Problems: Patient was invited to group but did not attend.   Tinsley Lomas E 09/12/2015, 11:11 AM 

## 2015-09-12 NOTE — Tx Team (Addendum)
Interdisciplinary Treatment Plan Update (Adult) Date: 09/12/2015   Date: 09/12/2015 1:13 PM  Progress in Treatment:  Attending groups: Yes Participating in groups: Yes Taking medication as prescribed: Yes  Tolerating medication: Yes  Family/Significant othe contact made: No, Pt declines Patient understands diagnosis: Yes AEB seeking help with depression Discussing patient identified problems/goals with staff: Yes  Medical problems stabilized or resolved: Yes  Denies suicidal/homicidal ideation: No, endorses passive SI Patient has not harmed self or Others: Yes   New problem(s) identified: None identified at this time.   Discharge Plan or Barriers: CSW will assess for appropriate discharge plan and relevant barriers.   Additional comments:  Patient and CSW reviewed pt's identified goals and treatment plan. Patient verbalized understanding and agreed to treatment plan. CSW reviewed [facility] "Discharge Process and Patient Involvement" Form. Pt verbalized understanding of information provided and signed form.   Reason for Continuation of Hospitalization:  Depression Medication stabilization Suicidal ideation  Estimated length of stay: 3-5 days  Review of initial/current patient goals per problem list:   1.  Goal(s): Patient will participate in aftercare plan  Met:  No  Target date: 3-5 days from date of admission   As evidenced by: Patient will participate within aftercare plan AEB aftercare provider and housing plan at discharge being identified.  09/12/15: CSW to work with Pt to assess for appropriate discharge plan and faciliate appointments and referrals as needed prior to d/c.   2.  Goal (s): Patient will exhibit decreased depressive symptoms and suicidal ideations.  Met:  No  Target date: 3-5 days from date of admission   As evidenced by: Patient will utilize self rating of depression at 3 or below and demonstrate decreased signs of depression or be deemed stable for  discharge by MD. 09/12/15: Pt was admitted with symptoms of depression, rating 10/10. Pt continues to present with flat affect and depressive symptoms.  Pt will demonstrate decreased symptoms of depression and rate depression at 3/10 or lower prior to discharge.  Attendees:  Patient:    Family:    Physician: Dr. Parke Poisson, MD  09/12/2015 1:13 PM  Nursing: Lars Pinks, RN Case manager  09/12/2015 1:13 PM  Clinical Social Worker Peri Maris, [place] 09/12/2015 1:13 PM  Other: Tilden Fossa, LCSWA 09/12/2015 1:13 PM  Clinical:  Gaylan Gerold, RN 09/12/2015 1:13 PM  Other: , RN Charge Nurse 09/12/2015 1:13 PM  Other: Hilda Lias, [place], [place] Social Work [phone]

## 2015-09-12 NOTE — Progress Notes (Signed)
[facility] MD Progress Note  09/12/2015 4:29 PM Luke Roberts  MRN:  [id number] Subjective:  Patient states he is " starting to feel a little better ". Denies medication side effects. Objective : I have discussed case with treatment team and have met with patient.  Patient reporting partial improvement of mood, although still feeling depressed . At this time he is denying any ongoing homicidal or violent ideations towards anyone, and specifically denies any HI towards [employer]. States " I just want to move on with my life and get better". Describes some chronic pain, due to which he uses a cane to assist with ambulation. Limited group participation - no disruptive behaviors on unit . Denies medication side effects at this time . Labs- TSH and Lipid panel WNL.   Principal Problem: Bipolar 1 disorder, depressed, severe ([place]) Diagnosis:   Patient Active Problem List   Diagnosis Date Noted  . Bipolar 1 disorder, depressed, severe ([place]) [F31.4] 09/11/2015   Total Time spent with patient: 20 minutes   Past Psychiatric History:    Past Medical History:  Past Medical History  Diagnosis Date  . Chronic back pain   . Bipolar 1 disorder ([place])   . Anxiety   . Depression     Past Surgical History  Procedure Laterality Date  . Knee surgery Left   . Ankle surgery Right    Family History: History reviewed. No pertinent family history.  Social History:  History  Alcohol Use  . 1.2 oz/week  . 2 Cans of beer per week    Comment: stated he usually drinks couple beers weekly     History  Drug Use No    Comment: denied all drug use    Social History   Social History  . Marital Status: Single    Spouse Name: N/A  . Number of Children: N/A  . Years of Education: N/A   Social History Main Topics  . Smoking status: Never Smoker   . Smokeless tobacco: Current User    Types: Snuff  . Alcohol Use: 1.2 oz/week    2 Cans of beer per week     Comment: stated he usually drinks  couple beers weekly  . Drug Use: No     Comment: denied all drug use  . Sexual Activity: No   Other Topics Concern  . 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 acetaminophen (TYLENOL) tablet 650 mg  650 mg Oral Q6H PRN Harriet Butte, NP   650 mg at 09/12/15 0826  . alum & mag hydroxide-simeth (MAALOX/MYLANTA) 200-200-20 MG/5ML suspension 30 mL  30 mL Oral Q4H PRN Harriet Butte, NP      . divalproex (DEPAKOTE ER) 24 hr tablet 500 mg  500 mg Oral QHS Myer Peer Sabena Winner, MD   500 mg at 09/11/15 2234  . hydrOXYzine (ATARAX/VISTARIL) tablet 25 mg  25 mg Oral Q6H PRN Harriet Butte, NP   25 mg at 09/11/15 2234  . magnesium hydroxide (MILK OF MAGNESIA) suspension 30 mL  30 mL Oral Daily PRN Harriet Butte, NP      .  nicotine (NICODERM CQ - dosed in mg/24 hours) patch 21 mg  21 mg Transdermal Daily Jenne Campus, MD   21 mg at 09/12/15 0826  . QUEtiapine (SEROQUEL) tablet 100 mg  100 mg Oral QHS Jenne Campus, MD   100 mg at 09/11/15 2234    Lab Results:  Results for orders placed or performed during the hospital encounter of 09/11/15 (from the past 48 hour(s))  Lipid panel     Status: None   Collection Time: 09/12/15  6:40 AM  Result Value Ref Range   Cholesterol 157 0 - 200 mg/dL   Triglycerides 87 <150 mg/dL   HDL 64 >40 mg/dL   Total CHOL/HDL Ratio 2.5 RATIO   VLDL 17 0 - 40 mg/dL   LDL Cholesterol 76 0 - 99 mg/dL    Comment:        Total Cholesterol/HDL:CHD Risk Coronary Heart Disease Risk Table                      Men   Women  1/2 Average Risk   3.4   3.3  Average Risk       5.0   4.4  2 X Average Risk   9.6   7.1  3 X Average Risk  23.4   11.0        Use the calculated Patient Ratio above and the CHD Risk Table to determine the patient's CHD Risk.        ATP III CLASSIFICATION (LDL):  <100     mg/dL   Optimal  100-129  mg/dL   Near or Above                    Optimal  130-159  mg/dL   Borderline  160-189  mg/dL   High  >190     mg/dL   Very High Performed at [facility]   TSH     Status: None   Collection Time: 09/12/15  6:40 AM  Result Value Ref Range   TSH 2.324 0.350 - 4.500 uIU/mL    Comment: Performed at [facility]    Physical Findings: AIMS: Facial and Oral Movements Muscles of Facial Expression: None, normal Lips and Perioral Area: None, normal Jaw: None, normal Tongue: None, normal,Extremity Movements Upper (arms, wrists, hands, fingers): None, normal Lower (legs, knees, ankles, toes): None, normal, Trunk Movements Neck, shoulders, hips: None, normal, Overall Severity Severity of abnormal movements (highest score from questions above): None, normal Incapacitation due to abnormal movements: None, normal Patient's awareness of abnormal movements (rate only patient's report): No Awareness, Dental Status Current problems with teeth and/or dentures?: Yes (needs dental work) Does patient usually wear dentures?: No  CIWA:  CIWA-Ar Total: 1 COWS:  COWS Total Score: 1  Musculoskeletal: Strength & Muscle Tone: within normal limits- walks with cane  Gait & Station: normal Patient leans: N/A  Psychiatric Specialty Exam: ROS denies chest pain or shortness of breath   Blood pressure 111/83, pulse 93, temperature 98.5 F (36.9 C), temperature source Oral, resp. rate 16, height _0  (1.88 m), weight 198 lb (89.812 kg), SpO2 97 %.Body mass index is 25.41 kg/(m^2).  General Appearance: Fairly Groomed  Engineer, water::  improved  Speech:  Normal  Rate  Volume:  Normal  Mood:  improving compared to admission  Affect:  Appropriate  Thought Process:  Linear  Orientation:  Other:  fully alert and attentive   Thought Content:  at this time denies hallucinations, no delusions, not internally preoccupied   Suicidal Thoughts:  No currently denies suicidal ideations and contracts for safety on unit   Homicidal Thoughts:  No as noted, today denies any homicidal or violent ideations towards anyone, and specifically also denies any violent or homicidal ideations towards [employer]:  recent and remote grossly intact   Judgement:  Fair  Insight:  Fair  Psychomotor Activity:  Decreased  Concentration:  Good  Recall:  Good  Fund of Knowledge:Good  Language: Good  Akathisia:  Negative  Handed:  Right  AIMS (if indicated):     Assets:  Communication Skills Desire for Improvement Resilience  ADL's: improved   Cognition: WNL  Sleep:  Number of Hours: 6.5  Assessment - patient improved compared to admission- less  Severely  depressed, not suicidal , and no longer endorsing homicidal or violent ideations. Tolerating medications well- as noted, states he has been stable on Depakote ER/Seroquel combination in the past .  Treatment Plan Summary: Daily contact with patient to assess and evaluate symptoms and progress in treatment, Medication management, Plan inpatient admission and medications as below   1. Continue to encourage milieu participation to work on coping skills and symptom reduction 2. Continue Depakote ER 500 mgrs QHS for mood disorder/bipolar disorder 3. Continue Seroquel 100 mgrs QHS for mood disorder/bipolar disorder  4. Continue Vistaril 25 mgrs Q 6 hours PRN for anxiety as needed 5. Treatment team working on disposition planning options.   Lavel Rieman 09/12/2015, 4:29 PM

## 2015-09-12 NOTE — [facility] Group Notes (Signed)
[facility] LCSW Group Therapy 09/12/2015 1:15 PM  Type of Therapy: Group Therapy- Feelings about Diagnosis  Pt did not attend, declined invitation.   Chad CordialLauren Carter, LCSWA 09/12/2015 4:23 PM

## 2015-09-12 NOTE — Plan of Care (Signed)
Problem: Diagnosis: Increased Risk For Suicide Attempt Goal: STG-Patient Will Attend All Groups On The Unit Outcome: Progressing Pt attended and engaged in evening wrap up group     

## 2015-09-12 NOTE — Progress Notes (Signed)
Adult Psychoeducational Group Note  Date:  09/12/2015 Time:  11:49 PM  Group Topic/Focus:  Wrap-Up Group:   The focus of this group is to help patients review their daily goal of treatment and discuss progress on daily workbooks.  Participation Level:  Active  Participation Quality:  Appropriate and Attentive  Affect:  Appropriate  Cognitive:  Alert, Appropriate and Oriented  Insight: Appropriate  Engagement in Group:  Engaged  Modes of Intervention:  Discussion and Education  Additional Comments:  Pt attended and participated in group.  Pt stated his goal today was to stay awake and pt reports that he was able to do this with the hopes of being able to sleep throughout the night.  Pt rated his day a 8/10 with 10 being the best.   Luke Roberts, Luke Roberts 09/12/2015, 11:49 PM

## 2015-09-12 NOTE — Progress Notes (Signed)
Patient ID: Luke Roberts, male   DOB: 09-Jul-1961, 54 y.o.   MRN: [id number]  DAR: Pt. Denies SI/HI and A/V Hallucinations. He reports sleep is fair, appetite is good, energy level is low, and concentration is poor. He rates depression, anxiety, and hopelessness 8/10. Patient does report pain in his left hip and received Tylenol however did not receive any relief. MD [place] notified about his pain. Support and encouragement provided to the patient. Scheduled medications administered to patient per physician's orders. Patient is cooperative but remains minimal and forwards little. His mood remains depressed and affect is flat. He is seen in the milieu but generally keeps to himself. Q15 minute checks are maintained for safety.

## 2015-09-13 LAB — HEMOGLOBIN A1C
HEMOGLOBIN A1C: 5.9 % — AB (ref 4.8–5.6)
Mean Plasma Glucose: 123 mg/dL

## 2015-09-13 NOTE — Progress Notes (Signed)
Patient ID: Luke Roberts, male   DOB: 06/29/1961, 54 y.o.   MRN: [id number] [facility] MD Progress Note  09/13/2015 7:12 PM Luke Roberts  MRN:  [id number] Subjective:  States he is " starting to feel a little better" and feels medications are " starting to kick in". He reports he is sleeping better . At this time he is planning on returning to [facility] when he discharges and trying to get a place to live once he gets his next check in early January. Denies medication side effects.  Objective : I have discussed case with treatment team and have met with patient.  Patient reports ongoing depression, but states he is better and at this time reports his depression as a " 5-6 over ten" Denies suicidal ideations at this time. He is more future oriented and is , as above, focusing more on disposition plans. He does report ongoing depression, low energy level , and some lingering pessimism, hopelessness about " things not getting better ". Has been attending groups. No disruptive behaviors on unit . HgbA1C, Lipid Panel, TSH WNL.   Principal Problem: Bipolar 1 disorder, depressed, severe ([place]) Diagnosis:   Patient Active Problem List   Diagnosis Date Noted  . Bipolar 1 disorder, depressed, severe ([place]) [F31.4] 09/11/2015   Total Time spent with patient: 20 minutes   Past Psychiatric History:    Past Medical History:  Past Medical History  Diagnosis Date  . Chronic back pain   . Bipolar 1 disorder ([place])   . Anxiety   . Depression     Past Surgical History  Procedure Laterality Date  . Knee surgery Left   . Ankle surgery Right    Family History: History reviewed. No pertinent family history.  Social History:  History  Alcohol Use  . 1.2 oz/week  . 2 Cans of beer per week    Comment: stated he usually drinks couple beers weekly     History  Drug Use No    Comment: denied all drug use    Social History   Social History  . Marital Status: Single    Spouse Name: N/A  . Number of  Children: N/A  . Years of Education: N/A   Social History Main Topics  . Smoking status: Never Smoker   . Smokeless tobacco: Current User    Types: Snuff  . Alcohol Use: 1.2 oz/week    2 Cans of beer per week     Comment: stated he usually drinks couple beers weekly  . Drug Use: No     Comment: denied all drug use  . Sexual Activity: No   Other Topics Concern  . 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 acetaminophen (TYLENOL) tablet 650 mg  650 mg Oral Q6H PRN Harriet Butte, NP   650 mg at 09/12/15 0826  . alum & mag hydroxide-simeth (MAALOX/MYLANTA) 200-200-20 MG/5ML suspension 30 mL  30 mL Oral Q4H PRN Harriet Butte, NP      . divalproex (DEPAKOTE ER) 24 hr tablet 500 mg  500 mg Oral QHS Maddisyn Hegwood A Trejon Duford,  MD   500 mg at 09/12/15 2126  . hydrOXYzine (ATARAX/VISTARIL) tablet 25 mg  25 mg Oral Q6H PRN Harriet Butte, NP   25 mg at 09/12/15 2126  . magnesium hydroxide (MILK OF MAGNESIA) suspension 30 mL  30 mL Oral Daily PRN Harriet Butte, NP      . nicotine (NICODERM CQ - dosed in mg/24 hours) patch 21 mg  21 mg Transdermal Daily Jenne Campus, MD   21 mg at 09/13/15 0859  . QUEtiapine (SEROQUEL) tablet 100 mg  100 mg Oral QHS Jenne Campus, MD   100 mg at 09/12/15 2126    Lab Results:  Results for orders placed or performed during the hospital encounter of 09/11/15 (from the past 48 hour(s))  Hemoglobin A1c     Status: Abnormal   Collection Time: 09/12/15  6:40 AM  Result Value Ref Range   Hgb A1c  MFr Bld 5.9 (H) 4.8 - 5.6 %    Comment: (NOTE)         Pre-diabetes: 5.7 - 6.4         Diabetes: >6.4         Glycemic control for adults with diabetes: <7.0    Mean Plasma Glucose 123 mg/dL    Comment: (NOTE) Performed At: [facility] [place], [place] [id number] Lindon Romp MD [id number] Performed at [facility]   Lipid panel     Status: None   Collection Time: 09/12/15  6:40 AM  Result Value Ref Range   Cholesterol 157 0 - 200 mg/dL   Triglycerides 87 <150 mg/dL   HDL 64 >40 mg/dL   Total CHOL/HDL Ratio 2.5 RATIO   VLDL 17 0 - 40 mg/dL   LDL Cholesterol 76 0 - 99 mg/dL    Comment:        Total Cholesterol/HDL:CHD Risk Coronary Heart Disease Risk Table                     Men   Women  1/2 Average Risk   3.4   3.3  Average Risk       5.0   4.4  2 X Average Risk   9.6   7.1  3 X Average Risk  23.4   11.0        Use the calculated Patient Ratio above and the CHD Risk Table to determine the patient's CHD Risk.        ATP III CLASSIFICATION (LDL):  <100     mg/dL   Optimal  100-129  mg/dL   Near or Above                    Optimal  130-159  mg/dL   Borderline  160-189  mg/dL   High  >190     mg/dL   Very High Performed at [facility]   TSH     Status: None   Collection Time: 09/12/15  6:40 AM  Result Value Ref Range   TSH 2.324 0.350 - 4.500 uIU/mL    Comment: Performed at [facility]    Physical Findings: AIMS: Facial and Oral Movements Muscles of Facial Expression: None, normal Lips and Perioral Area: None, normal Jaw: None, normal Tongue: None, normal,Extremity Movements Upper (arms, wrists, hands, fingers): None, normal Lower (legs, knees, ankles, toes): None, normal, Trunk Movements Neck, shoulders, hips: None, normal, Overall Severity Severity of abnormal movements (highest score from  questions above): None, normal Incapacitation due to abnormal movements: None,  normal Patient's awareness of abnormal movements (rate only patient's report): No Awareness, Dental Status Current problems with teeth and/or dentures?: Yes Does patient usually wear dentures?: No  CIWA:  CIWA-Ar Total: 1 COWS:  COWS Total Score: 1  Musculoskeletal: Strength & Muscle Tone: within normal limits- walks with cane  Gait & Station: normal Patient leans: N/A  Psychiatric Specialty Exam: ROS denies chest pain or shortness of breath , chronic knee/hip pain, due to which he ambulates with cane.   Blood pressure 130/89, pulse 78, temperature 98 F (36.7 C), temperature source Oral, resp. rate 16, height 6' 2"  (1.88 m), weight 198 lb (89.812 kg), SpO2 97 %.Body mass index is 25.41 kg/(m^2).  General Appearance: improved grooming  Eye Contact::  improved  Speech:  Normal Rate  Volume:  Normal  Mood:   Partially improved   Affect:  Appropriate  Thought Process:  Linear  Orientation:  Other:  fully alert and attentive   Thought Content:  at this time denies hallucinations, no delusions, not internally preoccupied   Suicidal Thoughts:  No currently denies suicidal ideations and contracts for safety on unit   Homicidal Thoughts:  No  Denies any homicidal ideations   Memory:  recent and remote grossly intact   Judgement:   Improved   Insight:   improved  Psychomotor Activity:   Improved   Concentration:  Good  Recall:  Good  Fund of Knowledge:Good  Language: Good  Akathisia:  Negative  Handed:  Right  AIMS (if indicated):     Assets:  Communication Skills Desire for Improvement Resilience  ADL's: improved   Cognition: WNL  Sleep:  Number of Hours: 6.5  Assessment -  Patient reports partial improvement of mood , although does not feel he is back to baseline and continues to report some neuro-vegetative symptoms. No suicidal or homicidal ideations. Chronic pain improved with NSAID PRNS.   Treatment Plan Summary: Daily contact with patient to assess and evaluate symptoms  and progress in treatment, Medication management, Plan inpatient admission and medications as below   1. Continue to encourage milieu participation to work on coping skills and symptom reduction 2. Continue Depakote ER 500 mgrs QHS for mood disorder/bipolar disorder 3. Continue Seroquel 100 mgrs QHS for mood disorder/bipolar disorder  4. Continue Vistaril 25 mgrs Q 6 hours PRN for anxiety as needed 5. Treatment team working on disposition planning options. 6. Obtain Valproic Serum level in AM   Luke Roberts 09/13/2015, 7:12 PM

## 2015-09-13 NOTE — Progress Notes (Signed)
D:  Patient's self inventory sheet, patient has fair sleep, sleep medication is helpful.  Good appetite, low energy level, poor concentration.  Rated depression, hopeless and anxiety #8.  Denied withdrawals.  Denied SI.  Pain L hip, low back.  Pain medication is not helpful.  Goal and plan is to try and stay awake.  Does have discharge plans.  Needs to have a place to stay and needs to talk to SW. A:  Medications administered per MD orders.  Emotional support and encouragement given patient. R:  Denied SI and HI, contracts for safety.  Denied A/V hallucinations.  Safety maintained with 15 minute checks.

## 2015-09-13 NOTE — Plan of Care (Signed)
Problem: Consults Goal: Anxiety Disorder Patient Education See Patient Education Module for eduction specifics.  Outcome: Progressing Nurse discussed anxiety/depression/coping skills with patient.        
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
## 2015-09-13 NOTE — Progress Notes (Signed)
Adult Psychoeducational Group Note  Date:  09/13/2015 Time:  10:53 PM  Group Topic/Focus:  Wrap-Up Group:   The focus of this group is to help patients review their daily goal of treatment and discuss progress on daily workbooks.  Participation Level:  Active  Participation Quality:  Appropriate  Affect:  Appropriate  Cognitive:  Alert  Insight: Appropriate  Engagement in Group:  Engaged  Modes of Intervention:  Discussion  Additional Comments:  Pt rated day a 6 and said he said he had "low energy" today. Something positive was that he had a good talk with the doctor. Goal for pt is to have positive thoughts.   Burman FreestoneCraddock, Kristin L 09/13/2015, 10:53 PM

## 2015-09-13 NOTE — [facility] Group Notes (Signed)
[facility] After[facility] Planning Group Note  09/13/2015 8:45 AM  Participation Quality: Alert, Appropriate and Oriented  Pt did not attend, declined invitation.   Chad CordialLauren Carter, LCSWA 09/13/2015 10:18 AM

## 2015-09-13 NOTE — [facility] Group Notes (Signed)
[facility] LCSW Group Therapy 09/13/2015 1:15 PM  Type of Therapy: Group Therapy- Emotion Regulation  Pt did not attend, declined invitation.   Chad CordialLauren Carter, LCSWA 09/13/2015 4:45 PM

## 2015-09-14 LAB — VALPROIC ACID LEVEL: Valproic Acid Lvl: 29 ug/mL — ABNORMAL LOW (ref 50.0–100.0)

## 2015-09-14 MED ORDER — DIVALPROEX SODIUM ER 250 MG PO TB24
750.0000 mg | ORAL_TABLET | Freq: Every day | ORAL | Status: DC
  Administered 2015-09-14: 750 mg via ORAL
  Filled 2015-09-14 (×4): qty 1

## 2015-09-14 NOTE — Progress Notes (Signed)
D: Luke Roberts states his goal was to stay positive. He states taking a nap earlier today helped him stay positive and "energized". He wants to make sure his resources are in [facility]. He rates Anxiety 8/10, Depression 7/10 and Hopelessness 8/10. Denies SI/HI/AVH. Contracts for safety.  A: Encouragement and support given. Q 15 minute checks for patient safety.  R: Continue to monitor for patient safety and medication effectiveness.

## 2015-09-14 NOTE — Progress Notes (Signed)
Patient ID: Luke Roberts, male   DOB: [birth year], 54 y.o.   MRN: [id number]  Adult Psychoeducational Group Note  Date:  09/14/2015 Time:  09:00am  Group Topic/Focus:  Self Care:   The focus of this group is to help patients understand the importance of self-care in order to improve or restore emotional, physical, spiritual, interpersonal, and financial health.  Participation Level:  Did Not Attend  Participation Quality: n/a  Affect:n/a  Cognitive: n/a  Insight: n/a  Engagement in Group:  n/a  Modes of Intervention:  Activity, Discussion, Education and Support  Additional Comments:  Pt did not attend group. Pt in bed asleep.   Luke Roberts, Luke Roberts 09/14/2015, 11:40 AM

## 2015-09-14 NOTE — [facility] Group Notes (Signed)
[facility] Mental Health Association Group Therapy 09/14/2015 1:15pm  Type of Therapy: Mental Health Association Presentation  Participation Level: Active  Participation Quality: Attentive  Affect: Appropriate  Cognitive: Oriented  Insight: Developing/Improving  Engagement in Therapy: Engaged  Modes of Intervention: Discussion, Education and Socialization  Summary of Progress/Problems: Mental Health Association (MHA) Speaker came to talk about his personal journey with substance abuse and addiction. The pt processed ways by which to relate to the speaker. MHA speaker provided handouts and educational information pertaining to groups and services offered by the MHA. Pt was engaged in speaker's presentation and was receptive to resources provided.    Adelise Buswell, MSW, LCSW Clinical Social Worker   

## 2015-09-14 NOTE — Progress Notes (Signed)
Patient ID: Luke Roberts, male   DOB: Jun 26, 1961, 54 y.o.   MRN: [id number] [facility] MD Progress Note  09/14/2015 6:20 PM Luke Roberts  MRN:  [id number] Subjective:  Patient is reporting partial improvement. Denies medication side effects. Future oriented, and discussing plans to return to [facility] after discharge, and renting an apartment once he gets next check. Objective : I have discussed case with treatment team and have met with patient.  Patient has continued to give relatively high scores to depression and anxiety , as reviewed in Nursing Notes, but admits to partial improvement and does feel medications are helping. Mood and affect are clearly improved compared to admission- smiles upon approach, improved eye contact, better related overall, and future oriented. No current symptoms of mania/hypomania- not irritable, no pressured speech, no grandiosity, no racing thoughts/ flight of ideations Tolerating medications well, denies side effects. Of note, Valproic Acid serum level 29- sub-therapeutic . Has been attending groups. No disruptive behaviors on unit .   Principal Problem: Bipolar 1 disorder, depressed, severe ([place]) Diagnosis:   Patient Active Problem List   Diagnosis Date Noted  . Bipolar 1 disorder, depressed, severe ([place]) [F31.4] 09/11/2015   Total Time spent with patient: 20 minutes   Past Psychiatric History:    Past Medical History:  Past Medical History  Diagnosis Date  . Chronic back pain   . Bipolar 1 disorder ([place])   . Anxiety   . Depression     Past Surgical History  Procedure Laterality Date  . Knee surgery Left   . Ankle surgery Right    Family History: History reviewed. No pertinent family history.  Social History:  History  Alcohol Use  . 1.2 oz/week  . 2 Cans of beer per week    Comment: stated he usually drinks couple beers weekly     History  Drug Use No    Comment: denied all drug use    Social History   Social History  . Marital  Status: Single    Spouse Name: N/A  . Number of Children: N/A  . Years of Education: N/A   Social History Main Topics  . Smoking status: Never Smoker   . Smokeless tobacco: Current User    Types: Snuff  . Alcohol Use: 1.2 oz/week    2 Cans of beer per week     Comment: stated he usually drinks couple beers weekly  . Drug Use: No     Comment: denied all drug use  . Sexual Activity: No   Other Topics Concern  . 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 acetaminophen (TYLENOL) tablet 650 mg  650 mg Oral Q6H PRN Harriet Butte, NP   650 mg at 09/12/15 0826  . alum & mag hydroxide-simeth (MAALOX/MYLANTA) 200-200-20 MG/5ML suspension 30 mL  30 mL Oral Q4H PRN Harriet Butte, NP      . divalproex (DEPAKOTE ER) 24 hr tablet 500 mg  500 mg Oral QHS Jenne Campus, MD   500 mg at 09/13/15 2158  . hydrOXYzine (ATARAX/VISTARIL) tablet 25 mg  25 mg  Oral Q6H PRN Harriet Butte, NP   25 mg at 09/12/15 2126  . magnesium hydroxide (MILK OF MAGNESIA) suspension 30 mL  30 mL Oral Daily PRN Harriet Butte, NP      . nicotine (NICODERM CQ - dosed in mg/24 hours) patch 21 mg  21 mg Transdermal Daily Jenne Campus, MD   21 mg at 09/13/15 0859  . QUEtiapine (SEROQUEL) tablet 100 mg  100 mg Oral QHS Jenne Campus, MD   100 mg at 09/13/15 2158    Lab Results:  Results for orders placed or performed during the hospital encounter of 09/11/15 (from the past 48 hour(s))  Valproic acid level     Status: Abnormal   Collection Time:  09/14/15  6:50 AM  Result Value Ref Range   Valproic Acid Lvl 29 (L) 50.0 - 100.0 ug/mL    Comment: Performed at [facility]    Physical Findings: AIMS: Facial and Oral Movements Muscles of Facial Expression: None, normal Lips and Perioral Area: None, normal Jaw: None, normal Tongue: None, normal,Extremity Movements Upper (arms, wrists, hands, fingers): None, normal Lower (legs, knees, ankles, toes): None, normal, Trunk Movements Neck, shoulders, hips: None, normal, Overall Severity Severity of abnormal movements (highest score from questions above): None, normal Incapacitation due to abnormal movements: None, normal Patient's awareness of abnormal movements (rate only patient's report): No Awareness, Dental Status Current problems with teeth and/or dentures?: Yes Does patient usually wear dentures?: No  CIWA:  CIWA-Ar Total: 0 COWS:  COWS Total Score: 0  Musculoskeletal: Strength & Muscle Tone: within normal limits- walks with cane  Gait & Station: normal Patient leans: N/A  Psychiatric Specialty Exam: ROS denies chest pain or shortness of breath , chronic knee/hip pain, due to which he ambulates with cane.  No abdominal pain, no tremors   Blood pressure 130/89, pulse 78, temperature 98 F (36.7 C), temperature source Oral, resp. rate 16, height 6' 2" (1.88 m), weight 198 lb (89.812 kg), SpO2 97 %.Body mass index is 25.41 kg/(m^2).  General Appearance: improved grooming  Eye Contact::  improved  Speech:  Normal Rate  Volume:  Normal  Mood:   Partially improved mood   Affect:  Appropriate, more reactive   Thought Process:  Linear  Orientation:  Other:  fully alert and attentive   Thought Content:  at this time denies hallucinations, no delusions, not internally preoccupied   Suicidal Thoughts:  No currently denies suicidal ideations and contracts for safety on unit   Homicidal Thoughts:  No  Denies any homicidal ideations   Memory:  recent and remote  grossly intact   Judgement:   Improved   Insight:   improved  Psychomotor Activity:   Improved   Concentration:  Good  Recall:  Good  Fund of Knowledge:Good  Language: Good  Akathisia:  Negative  Handed:  Right  AIMS (if indicated):     Assets:  Communication Skills Desire for Improvement Resilience  ADL's: improved   Cognition: WNL  Sleep:  Number of Hours: 6.5  Assessment -  Patient presents with partial but noticeable improvement of mood and affect, although has continued to report feeling depressed and anxious. No SI. Tolerating medications well, Valproic Acid level sub therapeutic. Focusing more on discharge, as he improves.    Treatment Plan Summary: Daily contact with patient to assess and evaluate symptoms and progress in treatment, Medication management, Plan inpatient admission and medications as below   1. Continue to encourage milieu participation  to work on coping skills and symptom reduction 2. Increase  Depakote ER  To 750 mgrs QHS for mood disorder/bipolar disorder- (prefer gradual titration rather than increasing to 1000 mgrs QHS, in order to minimize risk of side effects )  3. Continue Seroquel 100 mgrs QHS for mood disorder/bipolar disorder  4. Continue Vistaril 25 mgrs Q 6 hours PRN for anxiety as needed 5. Treatment team working on disposition planning options.    ,  09/14/2015, 6:20 PM  

## 2015-09-14 NOTE — Progress Notes (Signed)
Patient ID: Luke Roberts, male   DOB: 1960/12/20, 54 y.o.   MRN: [id number]   Pt currently presents with a flat affect and isolative behavior. Per self inventory, pt rates depression, hopelessness and anxiety at a 8. Pt's daily goal is to "think positive" and they intend to do so by "be happy." Pt reports good sleep, a good appetite, low energy and poor concentration. Pt remains in bed for most of the day today. Pt does not attend groups.   Pt provided with medications per providers orders. Pt's labs and vitals were monitored throughout the day. Pt supported emotionally and encouraged to express concerns and questions. Pt educated on medications.  Pt's safety ensured with 15 minute and environmental checks. Pt currently denies SI/HI and A/V hallucinations. Pt verbally agrees to seek staff if SI/HI or A/VH occurs and to consult with staff before acting on these thoughts. Will continue POC.

## 2015-09-14 NOTE — Progress Notes (Signed)
Adult Psychoeducational Group Note  Date:  09/14/2015 Time:  10:33 PM   Group Topic/Focus:  Wrap-Up Group:   The focus of this group is to help patients review their daily goal of treatment and discuss progress on daily workbooks.  Participation Level:  Did Not Attend  Participation Quality:  Did not attend  Affect:  Did not attend  Cognitive:  Did not attend  Insight: None  Engagement in Group:  Did not attend  Modes of Intervention:  Did not attend  Additional Comments:  Patient did not attend wrap up group tonight.   Kimsey Demaree L Estes Lehner 09/14/2015, 10:33 PM

## 2015-09-15 MED ORDER — NICOTINE 21 MG/24HR TD PT24
21.0000 mg | MEDICATED_PATCH | Freq: Every day | TRANSDERMAL | Status: AC
Start: 2015-09-15 — End: ?

## 2015-09-15 MED ORDER — QUETIAPINE FUMARATE 100 MG PO TABS: 100.0000 mg | ORAL_TABLET | Freq: Every day | ORAL | Status: AC

## 2015-09-15 MED ORDER — HYDROXYZINE HCL 25 MG PO TABS: 25.0000 mg | ORAL_TABLET | Freq: Four times a day (QID) | ORAL | Status: AC | PRN

## 2015-09-15 MED ORDER — ACETAMINOPHEN-CODEINE #3 300-30 MG PO TABS: 1.0000 | ORAL_TABLET | Freq: Four times a day (QID) | ORAL | Status: AC | PRN

## 2015-09-15 MED ORDER — DIVALPROEX SODIUM ER 250 MG PO TB24: 750.0000 mg | ORAL_TABLET | Freq: Every day | ORAL | Status: AC

## 2015-09-15 NOTE — Progress Notes (Signed)
Patient ID: Luke Roberts, male   DOB: 1961/04/08, 54 y.o.   MRN: [id number]   Pt discharged home with a bus pass. Pt was stable and appreciative at that time. All papers and prescriptions were given and valuables returned. Verbal understanding expressed. Denies SI/HI and A/VH. Pt given opportunity to express concerns and ask questions.

## 2015-09-15 NOTE — Progress Notes (Signed)
  [facility] Discharge Plan :  Will you be returning to the same living situation after discharge:  No. [facility] At discharge, do you have transportation home?: Yes,  Pt provided with a bus Do you have the ability to pay for your medications: Yes,  Pt provided with prescriptions  Release of information consent forms completed and in the chart;  Patient's signature needed at discharge.  Patient to Follow up at: Follow-up Information    Schedule an appointment as soon as possible for a visit with [facility].   Why:  Please attend [facility] Monday through Friday 8am-3pm for intake (Therapy and Medication Management)   Contact information:   [address] [place] [address]  Phone: [phone] Fax: [phone]      Next level of care provider [facility] Link:no  Safety Planning and Suicide Prevention discussed: Yes,  with Pt; declines family contact  Have you used any form of tobacco in the last 30 days? (Cigarettes, Smokeless Tobacco, Cigars, and/or Pipes): Yes  Has patient been referred to the Quitline?: Patient refused referral  Patient has been referred for addiction treatment: N/A  Elaina HoopsCarter, Othar Curto M 09/15/2015, 2:41 PM

## 2015-09-15 NOTE — Progress Notes (Signed)
Patient ID: Luke Roberts, male   DOB: [birth year], 54 y.o.   MRN: [id number]   Pt currently presents with a flat affect and anxious behavior. Per self inventory, pt rates depression, hopelessness and anxiety at a 9. Pt's daily goal is to "don't know" and they intend to do so by "don't know."  Pt reports good sleep, a good appetite, low energy and poor concentration. Pt reports concern about housing post discharge, MD notified.  Pt provided with medications per providers orders. Pt's labs and vitals were monitored throughout the day. Pt supported emotionally and encouraged to express concerns and questions. Pt educated on medications. Pt encouraged to take an active role in his discharge plan and to attend groups today. Pt consults with social worker about housing.  Pt's safety ensured with 15 minute and environmental checks. Pt endorses SI and HI, "If I'm out on the streets" but no plan [facility]. Pt currently endorses A/V hallucinations. Pt verbally agrees to seek staff if SI/HI or A/VH occurs [facility] and to consult with staff before acting on harmful thoughts.  Pt more present in the milieu today. Will continue POC.

## 2015-09-15 NOTE — Discharge Summary (Signed)
Physician Discharge Summary Note  Patient:  Luke Roberts is an 54 y.o., male MRN:  [id number] DOB:  [birth year] Patient phone:  [phone] (home)  Patient address:   [place] [address],  Total Time spent with patient: Greater than 30 minutes  Date of Admission:  09/11/2015 Date of Discharge: 09-15-15  Reason for Admission: Worsening symptoms of bipolar disorder  Principal Problem: Bipolar 1 disorder, depressed, severe (HCC)  Discharge Diagnoses: Patient Active Problem List   Diagnosis Date Noted  . Bipolar 1 disorder, depressed, severe (HCC) [F31.4] 09/11/2015   Past Psychiatric History: Bipolar disorder  Past Medical History:  Past Medical History  Diagnosis Date  . Chronic back pain   . Bipolar 1 disorder (HCC)   . Anxiety   . Depression     Past Surgical History  Procedure Laterality Date  . Knee surgery Left   . Ankle surgery Right    Family History: History reviewed. No pertinent family history.  Family Psychiatric  History: See H&P  Social History:  History  Alcohol Use  . 1.2 oz/week  . 2 Cans of beer per week    Comment: stated he usually drinks couple beers weekly     History  Drug Use No    Comment: denied all drug use    Social History   Social History  . Marital Status: Single    Spouse Name: N/A  . Number of Children: N/A  . Years of Education: N/A   Social History Main Topics  . Smoking status: Never Smoker   . Smokeless tobacco: Current User    Types: Snuff  . Alcohol Use: 1.2 oz/week    2 Cans of beer per week     Comment: stated he usually drinks couple beers weekly  . Drug Use: No     Comment: denied all drug use  . Sexual Activity: No   Other Topics Concern  . None   Social History Narrative   Hospital Course:  54 year old man.Reports he has been diagnosed with Bipolar Disorder in the past. States he has not been taking his prescribed medications for several weeks " because I could not afford them and I could  not get anybody to help me ". States he became increasingly frustrated because he was " going to all the places which they say will help you, and I was just getting the run around, they don't really help". States he started feeling more depressed over recent days to weeks, made worse by holiday season, which has intensified his sense of loneliness. He reports he felt " as if life was not worth living anymore".He states he was thinking of jumping off a bridge . He also developed vague HI towards " whoever is running [employer] ", which he attributed to the frustration he felt about not being helped. States a passerby stopped and asked him if he was OK and on finding out patient had some suicidal ideations, brought him to hospital ( yesterday).  Luke Roberts was admitted to the [facility] for worsening symptoms of Bipolar affective disorder. He has not been on his mental health medications for several weeks because he was unable to afford them. His depression worsened as a result, he developed SIHI. After admission assessment,Luke Roberts's presenting symptoms were identified. The medication regimen targeting those symptoms were initiated. He was medicated & discharged on; Seroquel 100 mg for mood control, Depakote ER 250 mg for mood stabilization & Hydroxyzine 25 mg for anxiety. He presented no other significant  pre-existing medical problems that required treatment & or monitoring. He tolerated his treatment regimen without any adverse effects reported.  As his treatment progressed, Luke Roberts's improvement was monitored & noted by observation of his daily reports of symptom reduction. His emotional & mental status were also monitored by daily self-inventory assessment reports completed by Luke Roberts & the clinical staff. He was evaluated by the treatment team for stability and plans for continued recovery after discharge. Luke Roberts's motivation was an integral factor in his mood stability. He was recommended further treatment  options upon discharge by referring & scheduling him an outpatient psychiatric clinic for follow-up visits & medication managment as listed below.     Upon discharge, Luke Roberts was both mentally & medically stable for discharge denying SIHI,, auditory/visual/tactile hallucinations, delusional thoughts & or paranoia. He left [facility] in no apparent distress. Transportation per bus.  Physical Findings: AIMS: Facial and Oral Movements Muscles of Facial Expression: None, normal Lips and Perioral Area: None, normal Jaw: None, normal Tongue: None, normal,Extremity Movements Upper (arms, wrists, hands, fingers): None, normal Lower (legs, knees, ankles, toes): None, normal, Trunk Movements Neck, shoulders, hips: None, normal, Overall Severity Severity of abnormal movements (highest score from questions above): None, normal Incapacitation due to abnormal movements: None, normal Patient's awareness of abnormal movements (rate only patient's report): No Awareness, Dental Status Current problems with teeth and/or dentures?: Yes Does patient usually wear dentures?: No  CIWA:  CIWA-Ar Total: 0 COWS:  COWS Total Score: 0  Musculoskeletal: Strength & Muscle Tone: within normal limits Gait & Station: normal Patient leans: N/A  Psychiatric Specialty Exam: Review of Systems  Constitutional: Negative.   HENT: Negative.   Eyes: Negative.   Respiratory: Negative.   Cardiovascular: Negative.   Gastrointestinal: Negative.   Genitourinary: Negative.   Musculoskeletal: Negative.   Skin: Negative.   Neurological: Negative.   Endo/Heme/Allergies: Negative.   Psychiatric/Behavioral: Positive for depression (Stable). Negative for suicidal ideas, hallucinations, memory loss and substance abuse. The patient has insomnia (Stable). The patient is not nervous/anxious.     Blood pressure 122/81, pulse 82, temperature 97.5 F (36.4 C), temperature source Oral, resp. rate 16, height  (1.88 m), weight 89.812 kg  (198 lb), SpO2 97 %.Body mass index is 25.41 kg/(m^2).  See Md's SRA   Have you used any form of tobacco in the last 30 days? (Cigarettes, Smokeless Tobacco, Cigars, and/or Pipes): Yes  Has this patient used any form of tobacco in the last 30 days? (Cigarettes, Smokeless Tobacco, Cigars, and/or Pipes) Yes, No  Metabolic Disorder Labs:  Lab Results  Component Value Date   HGBA1C 5.9* 09/12/2015   MPG 123 09/12/2015   No results found for: PROLACTIN Lab Results  Component Value Date   CHOL 157 09/12/2015   TRIG 87 09/12/2015   HDL 64 09/12/2015   CHOLHDL 2.5 09/12/2015   VLDL 17 09/12/2015   LDLCALC 76 09/12/2015   See Psychiatric Specialty Exam and Suicide Risk Assessment completed by Attending Physician prior to discharge.  Discharge destination:  Home  Is patient on multiple antipsychotic therapies at discharge:  No   Has Patient had three or more failed trials of antipsychotic monotherapy by history:  No  Recommended Plan for Multiple Antipsychotic Therapies: NA    Medication List    STOP taking these medications        buPROPion 100 MG tablet  Commonly known as:  WELLBUTRIN     divalproex 500 MG DR tablet  Commonly known as:  DEPAKOTE  Replaced by:  divalproex 250 MG 24 hr tablet      TAKE these medications      Indication   acetaminophen-codeine 300-30 MG tablet  Commonly known as:  TYLENOL #3  Take 1-2 tablets by mouth every 6 (six) hours as needed for moderate pain.   Indication:  Mild to Moderate Pain     divalproex 250 MG 24 hr tablet  Commonly known as:  DEPAKOTE ER  Take 3 tablets (750 mg total) by mouth at bedtime. For mood stabilization   Indication:  Mood stabilization     hydrOXYzine 25 MG tablet  Commonly known as:  ATARAX/VISTARIL  Take 1 tablet (25 mg total) by mouth every 6 (six) hours as needed for anxiety (sleep).   Indication:  Anxiety/sleep     nicotine 21 mg/24hr patch  Commonly known as:  NICODERM CQ - dosed in mg/24 hours  Place  1 patch (21 mg total) onto the skin daily. For nicotine withdrawal   Indication:  Nicotine Addiction     QUEtiapine 100 MG tablet  Commonly known as:  SEROQUEL  Take 1 tablet (100 mg total) by mouth at bedtime. For mood control   Indication:  Mood control       Follow-up Information    Schedule an appointment as soon as possible for a visit with [facility].   Why:  Please attend [facility] In [facility] Monday through Friday 8am-3pm for intake (Therapy and Medication Management)   Contact information:   [address] [place] [address]  Phone: [phone] Fax: [phone]    Follow-up recommendations: Activity:  As tolerated Diet: As recommended by your primary care doctor. Keep all scheduled follow-up appointments as recommended.   Comments: Take all your medications as prescribed by your mental healthcare provider. Report any adverse effects and or reactions from your medicines to your outpatient provider promptly. Patient is instructed and cautioned to not engage in alcohol and or illegal drug use while on prescription medicines. In the event of worsening symptoms, patient is instructed to call the crisis hotline, 911 and or go to the nearest ED for appropriate evaluation and treatment of symptoms. Follow-up with your primary care provider for your other medical issues, concerns and or health care needs.   Signed: Sanjuana KavaNwoko, Agnes I, PMHNP, FNP-BC 09/16/2015, 9:34 AM  Patient seen, Suicide Assessment Completed.  Disposition Plan Reviewed

## 2015-09-15 NOTE — Tx Team (Addendum)
Interdisciplinary Treatment Plan Update (Adult) Date: 09/15/2015   Date: 09/15/2015 8:27 AM  Progress in Treatment:  Attending groups: Intermittently Participating in groups: Minimally Taking medication as prescribed: Yes  Tolerating medication: Yes  Family/Significant othe contact made: No, Pt declines Patient understands diagnosis: Yes AEB seeking help with depression Discussing patient identified problems/goals with staff: Yes  Medical problems stabilized or resolved: Yes  Denies suicidal/homicidal ideation: Yes Patient has not harmed self or Others: Yes   New problem(s) identified: None identified at this time.   Discharge Plan or Barriers: Pt does not have a plan at this time, but reports that next week he will have income to afford housing.    Additional comments:  Patient and CSW reviewed pt's identified goals and treatment plan. Patient verbalized understanding and agreed to treatment plan. CSW reviewed [facility] "Discharge Process and Patient Involvement" Form. Pt verbalized understanding of information provided and signed form.   Reason for Continuation of Hospitalization:  Depression Medication stabilization Suicidal ideation  Estimated length of stay: 0 days; Pt stable for DC  Review of initial/current patient goals per problem list:   1.  Goal(s): Patient will participate in aftercare plan  Met:  Yes  Target date: 3-5 days from date of admission   As evidenced by: Patient will participate within aftercare plan AEB aftercare provider and housing plan at discharge being identified.  09/12/15: CSW to work with Pt to assess for appropriate discharge plan and faciliate appointments and referrals as needed prior to d/c. 09/15/15: Pt will DC to [facility] and follow[facility]up at [facility].  2.  Goal (s): Patient will exhibit decreased depressive symptoms and suicidal ideations.  Met:  Adequate for DC  Target date: 3-5 days from date of admission   As evidenced by: Patient  will utilize self rating of depression at 3 or below and demonstrate decreased signs of depression or be deemed stable for discharge by MD. 09/12/15: Pt was admitted with symptoms of depression, rating 10/10. Pt continues to present with flat affect and depressive symptoms.  Pt will demonstrate decreased symptoms of depression and rate depression at 3/10 or lower prior to discharge. 09/15/15: MD feels that Pt's symptoms have decreased to the point that they can be managed in an outpatient setting.  Attendees:  Patient:    Family:    Physician: Dr. Parke Poisson, MD  09/15/2015 8:27 AM  Nursing: Lars Pinks, RN Case manager  09/15/2015 8:27 AM  Clinical Social Worker Peri Maris, [place] 09/15/2015 8:27 AM  Other: Tilden Fossa, [place] 09/15/2015 8:27 AM  Clinical:  Marcella Dubs, RN 09/15/2015 8:27 AM  Other: , RN Charge Nurse 09/15/2015 8:27 AM  Other:     Peri Maris, [place] Social Work [phone]

## 2015-09-15 NOTE — Progress Notes (Signed)
D: Luke Roberts is still concerned about his discharge plan and being homeless. He rates Anxiety 7/10 Depression 8/10 and Hopelessness 8/10. His goal today was to think positive however he states it has been difficult to do as he continues to worry about where he will stay once he is discharged from the unit. He denies SI/HI/AVH at this time and contracts for safety. Encouraged Luke Roberts to continue to work with the Child psychotherapistsocial worker regarding placement.  A: Q 15 minute checks for patient safety. Encouragement and support given. Medications administered as prescribed.  R: Continue to monitor for patient safety and medication effectiveness.

## 2015-09-15 NOTE — Progress Notes (Signed)
Patient ID: Luke Roberts, male   DOB: 11-01-1960, 54 y.o.   MRN: [id number] PER STATE REGULATIONS 482.30  THIS CHART WAS REVIEWED FOR MEDICAL NECESSITY WITH RESPECT TO THE PATIENT'S ADMISSION/ DURATION OF STAY.  NEXT REVIEW DATE: 09/18/2014  Willa RoughJENNIFER JONES Fern Asmar, RN, BSN CASE MANAGER

## 2015-09-15 NOTE — [facility] Suicide Risk Assessment (Signed)
[facility] INPATIENT:  Family/Significant Other Suicide Prevention Education  Suicide Prevention Education:  Patient Refusal for Family/Significant Other Suicide Prevention Education: The patient Luke Roberts has refused to provide written consent for family/significant other to be provided Family/Significant Other Suicide Prevention Education during admission and/or prior to discharge.  Physician notified. SPE reviewed with patient and brochure provided. Patient encouraged to return to hospital if having suicidal thoughts, patient verbalized his/her understanding and has no further questions at this time.   Elaina Hoopsarter, Kayti Poss M 09/15/2015, 2:43 PM

## 2015-09-15 NOTE — [facility] Group Notes (Signed)
[facility]  09/15/2015 8:45 AM  Participation Quality: Alert, Appropriate and Oriented  Mood/Affect: Flat  Depression Rating: 7-8  Anxiety Rating: 7-8  Thoughts of Suicide: Pt denies SI/HI  Will you contract for safety? Yes  Current AVH: Pt denies  Plan for Discharge/Comments: Pt attended discharge planning group and actively participated in group. CSW discussed suicide prevention education with the group and encouraged them to discuss discharge planning and any relevant barriers. Pt continues to present with flat affect and depressed mood. He expresses anxiety related to DC plan. Pt receptive to list of boarding homes provided by CSW.  Transportation Means: Pt reports access to transportation  Supports: No supports mentioned at this time  Chad CordialLauren Carter, LCSWA 09/15/2015 10:11 AM

## 2015-09-15 NOTE — [facility] Suicide Risk Assessment (Signed)
[facility] Discharge Suicide Risk Assessment   Demographic Factors:  54 year old male , currently homeless, on disability  Total Time spent with patient: 30 minutes  Musculoskeletal: Strength & Muscle Tone: within normal limits Gait & Station: normal- walks with cane  Patient leans: N/A  Psychiatric Specialty Exam: Physical Exam  ROS  Blood pressure 122/81, pulse 82, temperature 97.5 F (36.4 C), temperature source Oral, resp. rate 16, height  (1.88 m), weight 198 lb (89.812 kg), SpO2 97 %.Body mass index is 25.41 kg/(m^2).  General Appearance: Fairly Groomed  Patent attorney::  Good  Speech:  Normal Rate409  Volume:  Normal  Mood:  improved mood , improved range of affect  Affect:  Appropriate  Thought Process:  Linear  Orientation:  Full (Time, Place, and Person)  Thought Content:  denies hallucinations, no delusions   Suicidal Thoughts:  No- denies any suicidal ideations, denies any self injurious  ideations  Homicidal Thoughts:  No denies any violent or homicidal ideations towards anyone   Memory:  recent and remote grossly intact   Judgement:  Other:  improved  Insight:  Present  Psychomotor Activity:  Normal  Concentration:  Good  Recall:  Good  Fund of Knowledge:Good  Language: Good  Akathisia:  Negative  Handed:  Right  AIMS (if indicated):     Assets:  Communication Skills Desire for Improvement Resilience  Sleep:  Number of Hours: 6.5  Cognition: WNL  ADL's:  Intact   Have you used any form of tobacco in the last 30 days? (Cigarettes, Smokeless Tobacco, Cigars, and/or Pipes): Yes  Has this patient used any form of tobacco in the last 30 days? (Cigarettes, Smokeless Tobacco, Cigars, and/or Pipes) No  Mental Status Per Nursing Assessment::   On Admission:  Suicidal ideation indicated by patient, Thoughts of violence towards others  Current Mental Status by Physician: At this time patient reports partial improvement in mood, affect is fuller in range, no thought  disorder, no suicidal ideations, no homicidal ideations, no psychotic symptoms, and future oriented .  Loss Factors: Homelessness, limited support network  Historical Factors: History of Bipolar Disorder   Risk Reduction Factors:   Positive coping skills or problem solving skills  Continued Clinical Symptoms:  Patient has reported some ongoing depression, anxiety related mostly to disposition planning - no SI or HI, no psychotic symptoms. Today reports improvement and states feels ready for discharge. Plans to stay at shelter until early next week, when he plans to rent room/apartment with his next check.   Cognitive Features That Contribute To Risk:  No gross cognitive deficits noted upon discharge. Is alert , attentive, and oriented x 3   Suicide Risk:  Mild:  Suicidal ideation of limited frequency, intensity, duration, and specificity.  There are no identifiable plans, no associated intent, mild dysphoria and related symptoms, good self-control (both objective and subjective assessment), few other risk factors, and identifiable protective factors, including available and accessible social support.  Principal Problem: Bipolar 1 disorder, depressed, severe (HCC) Discharge Diagnoses:  Patient Active Problem List   Diagnosis Date Noted  . Bipolar 1 disorder, depressed, severe (HCC) [F31.4] 09/11/2015      Plan Of Care/Follow-up recommendations:  Activity:  as tolerated  Diet:  regular Tests:  NA Other:  See below   Is patient on multiple antipsychotic therapies at discharge:  No   Has Patient had three or more failed trials of antipsychotic monotherapy by history:  No  Recommended Plan for Multiple Antipsychotic Therapies: NA  Patient states he feels ready for discharge today - no grounds for involuntary commitment . Plans to continue outpatient psychiatric management .      COBOS, FERNANDO 09/15/2015, 10:34 AM

## 2016-07-21 ENCOUNTER — Emergency Department (HOSPITAL_COMMUNITY)
Admission: EM | Admit: 2016-07-21 | Discharge: 2016-07-21 | Disposition: A | Discharge status: Home / Self Care | Payer: Medicare Other | Attending: Emergency Medicine | Admitting: Emergency Medicine

## 2016-07-21 ENCOUNTER — Encounter (HOSPITAL_COMMUNITY): Payer: Self-pay | Admitting: Emergency Medicine

## 2016-07-21 ENCOUNTER — Emergency Department (HOSPITAL_COMMUNITY): Payer: Medicare Other

## 2016-07-21 DIAGNOSIS — Z79899 Other long term (current) drug therapy: Secondary | ICD-10-CM | POA: Diagnosis not present

## 2016-07-21 DIAGNOSIS — M25561 Pain in right knee: Secondary | ICD-10-CM | POA: Diagnosis not present

## 2016-07-21 DIAGNOSIS — G8929 Other chronic pain: Secondary | ICD-10-CM | POA: Diagnosis not present

## 2016-07-21 HISTORY — DX: Pain in right knee: M25.561

## 2016-07-21 MED ORDER — ACETAMINOPHEN 325 MG PO TABS
650.0000 mg | ORAL_TABLET | Freq: Once | ORAL | Status: DC
  Filled 2016-07-21: qty 2

## 2016-07-21 NOTE — Discharge Instructions (Signed)
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 You need to wear the knee brace ad continue to use your cane. Rest,ice and elevate your leg. Follow up with an orthopeadist. I have given you a referral you need to call and make an appointment. You can continue to take tylenol and ibuprofen. You may alternate every 3 hours. I have also give you a referral number to [facility] for [facility] help. You need to call them and follow up. Return to the ED if you symptoms worsen or if your knee becomes red, you are unable to bend it or if you develop fevers.

## 2016-07-21 NOTE — ED Triage Notes (Signed)
Per EMS pt called EMS with c/o r/knee "giving out". Denied trauma. Swelling noted on R/L-lateral  Pt unable to bear weight w/o pain. Ice pack applied. Pt alert, oriented andd cooperative.

## 2016-07-21 NOTE — ED Notes (Signed)
Bed: WTR5 Expected date:  Expected time:  Means of arrival:  Comments: Knee pain

## 2016-07-21 NOTE — ED Provider Notes (Signed)
[facility]-EMERGENCY DEPT Provider Note   CSN: [id number] Arrival date & time: 07/21/16  1440  By signing my name below, I, Rosario AdieWilliam Andrew Hiatt, attest that this documentation has been prepared under the direction and in the presence of Demetrios LollKenneth Wallis Vancott, PA-C.  Electronically Signed: Rosario AdieWilliam Andrew Hiatt, ED Scribe. 07/21/16. 3:56 PM.  History   Chief Complaint Chief Complaint  Patient presents with  . Knee Pain    r/knee pain  . Alcohol Problem    requesting resources for treatment   The history is provided by the patient. No language interpreter was used.   HPI Comments: Luke Roberts is a 55 y.o. male BIB EMS, with no pertinent PMHx, who presents to the Emergency Department complaining of acute on chronic, intermittent right knee pain onset approximately 2 hours ago. He reports associated right knee swelling and notes that his knee will occasionally "give out" secondary to his current pain. He denies any recent trauma, injury, or falls to sustain his current pain. Pt has a h/o of right knee pain which he has not been followed by an Orthopedic specialist for. He has been applying Icy Hot cream with minimal relief of his current pain. His pain is exacerbated with bearing weight and ambulation. Pt ambulates with a cane at baseline due to a h/o of arthritis of the left hip. No h/o Gout. He denies weakness, numbness, or any other associated symptoms.   While at bedside, he is additionally requesting resources for his alcohol addition.   Past Medical History:  Diagnosis Date  . Anxiety   . Bipolar 1 disorder (HCC)   . Chronic back pain   . Depression   . Knee pain, right    reports hx of recurrent knee pain   Patient Active Problem List   Diagnosis Date Noted  . Bipolar 1 disorder, depressed, severe (HCC) 09/11/2015   Past Surgical History:  Procedure Laterality Date  . ANKLE SURGERY Right   . KNEE SURGERY Left     Home Medications    Prior to Admission medications   Medication  Sig Start Date End Date Taking? Authorizing Provider  acetaminophen-codeine (TYLENOL #3) 300-30 MG tablet Take 1-2 tablets by mouth every 6 (six) hours as needed for moderate pain. 09/15/15   Sanjuana KavaAgnes I Nwoko, NP  divalproex (DEPAKOTE ER) 250 MG 24 hr tablet Take 3 tablets (750 mg total) by mouth at bedtime. For mood stabilization 09/15/15   Sanjuana KavaAgnes I Nwoko, NP  hydrOXYzine (ATARAX/VISTARIL) 25 MG tablet Take 1 tablet (25 mg total) by mouth every 6 (six) hours as needed for anxiety (sleep). 09/15/15   Sanjuana KavaAgnes I Nwoko, NP  nicotine (NICODERM CQ - DOSED IN MG/24 HOURS) 21 mg/24hr patch Place 1 patch (21 mg total) onto the skin daily. For nicotine withdrawal 09/15/15   Sanjuana KavaAgnes I Nwoko, NP  QUEtiapine (SEROQUEL) 100 MG tablet Take 1 tablet (100 mg total) by mouth at bedtime. For mood control 09/15/15   Sanjuana KavaAgnes I Nwoko, NP   Family History History reviewed. No pertinent family history.  Social History Social History  Substance Use Topics  . Smoking status: Never Smoker  . Smokeless tobacco: Current User    Types: Snuff  . Alcohol use 16.8 oz/week    28 Cans of beer per week     Comment: 4 cans of beer a day   Allergies   Patient has no known allergies.  Review of Systems Review of Systems  Musculoskeletal: Positive for arthralgias (right knee).  Neurological: Negative for weakness and  numbness.  All other systems reviewed and are negative.  Physical Exam Updated Vital Signs BP 100/67 (BP Location: Left Arm)   Pulse 70   Temp 98 F (36.7 C) (Oral)   Resp 20   Wt 210 lb (95.3 kg)   SpO2 97%   BMI 26.96 kg/m    Physical Exam  Constitutional: He appears well-developed and well-nourished.  HENT:  Head: Normocephalic.  Eyes: Conjunctivae are normal.  Cardiovascular: Normal rate.   Pulmonary/Chest: Effort normal. No respiratory distress.  Abdominal: He exhibits no distension.  Musculoskeletal: Normal range of motion.  Full ROM of the right knee. Mild effusion. Negative Ballotment's sign.  No erythema, ecchymosis, deformity, or crepitus noted. TTP ot the lateral and medial joint line. No joint laxity noted. DP pulses are 2+ bilaterally. Sensation intact. Capillary refill is normal.  Strength is 5/5.  Neurological: He is alert.  Skin: Skin is warm and dry.  Psychiatric: He has a normal mood and affect. His behavior is normal.  Nursing note and vitals reviewed.  ED Treatments / Results  DIAGNOSTIC STUDIES: Oxygen Saturation is 97% on RA, normal by my interpretation.   COORDINATION OF CARE: 3:52 PM-Discussed next steps with pt. Pt verbalized understanding and is agreeable with the plan.   Radiology Dg Knee Complete 4 Views Right  Result Date: 07/21/2016 CLINICAL DATA:  Right knee pain.  No reported injury. EXAM: RIGHT KNEE - COMPLETE 4+ VIEW COMPARISON:  None. FINDINGS: Small suprapatellar right knee joint effusion. No fracture, dislocation or suspicious focal osseous lesion. Mild-to-moderate tricompartmental osteoarthritis, most prominent in the lateral compartment. IMPRESSION: 1. No fracture or malalignment. 2. Small suprapatellar right knee joint effusion. 3. 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 Electronically Signed   By: Delbert Phenix M.D.   On: 07/21/2016 16:36   Procedures Procedures   Medications Ordered in ED Medications - No data to display  Initial Impression / Assessment and Plan / ED Course  I have reviewed the triage vital signs and the nursing notes.  Pertinent imaging results that were available during my care of the patient were reviewed by me and considered in my medical decision making (see chart for details).  Clinical Course    Pt is a 55 yo male who presents into the ED with acute on chronic right knee pain. Patient X-Ray negative for obvious fracture or dislocation.  Impression otherwise indicates: 1. No fracture or malalignment. 2. Small suprapatellar right knee joint effusion. 3.  Mild-to-moderate tricompartmental right knee osteoarthritis, most prominent in the lateral compartment, per radiology. Pt advised to follow up with orthopedics if symptoms persist for possibility of missed fracture diagnosis and further management of arthritis. There could also be a degree of gout but patient has no history. History of alcohol use. No signs of gout or septic arthritis at this time. Patient has full ROM of the right knee. No erythema or sig edema. Patient is afebrile and not ill appearing. Patient given brace while in ED, conservative therapy recommended and discussed. Patient will be dc home & is agreeable with above plan.   Will also give the pt resources for outside help for his alcohol abuse.  Final Clinical Impressions(s) / ED Diagnoses   Final diagnoses:  Chronic pain of right knee   New Prescriptions Discharge Medication List as of 07/21/2016  5:12 PM     I personally performed the services described in this documentation, which was scribed in my presence. The recorded information has been reviewed  and is accurate.     Rise MuKenneth T Taelyn Broecker, PA-C 07/22/16 1340    Maia PlanJoshua G Long, MD 07/23/16 1002

## 2016-07-21 NOTE — ED Triage Notes (Signed)
Pt stated that he was walking and his r/knee became painful. R/knee "gave out" and he went to find a phone to call 911

## 2017-03-13 ENCOUNTER — Emergency Department

## 2017-03-13 ENCOUNTER — Emergency Department: Admit: 2017-03-13 | Payer: MEDICARE | Primary: Family Medicine

## 2017-03-13 ENCOUNTER — Inpatient Hospital Stay
Admit: 2017-03-13 | Discharge: 2017-03-13 | Disposition: A | Discharge status: Home / Self Care | Payer: MEDICARE | Attending: Emergency Medicine

## 2017-03-13 DIAGNOSIS — R42 Dizziness and giddiness: Secondary | ICD-10-CM

## 2017-03-13 LAB — CBC WITH AUTOMATED DIFF
ABS. BASOPHILS: 0 10*3/uL (ref 0.0–0.1)
ABS. EOSINOPHILS: 0 10*3/uL (ref 0.0–0.4)
ABS. IMM. GRANS.: 0 10*3/uL (ref 0.00–0.04)
ABS. LYMPHOCYTES: 0.8 10*3/uL (ref 0.8–3.5)
ABS. MONOCYTES: 0.5 10*3/uL (ref 0.0–1.0)
ABS. NEUTROPHILS: 7.8 10*3/uL (ref 1.8–8.0)
ABSOLUTE NRBC: 0 10*3/uL (ref 0.00–0.01)
BASOPHILS: 0 % (ref 0–1)
EOSINOPHILS: 0 % (ref 0–7)
HCT: 39.7 % (ref 36.6–50.3)
HGB: 13.8 g/dL (ref 12.1–17.0)
IMMATURE GRANULOCYTES: 0 % (ref 0.0–0.5)
LYMPHOCYTES: 9 % — ABNORMAL LOW (ref 12–49)
MCH: 31.4 PG (ref 26.0–34.0)
MCHC: 34.8 g/dL (ref 30.0–36.5)
MCV: 90.2 FL (ref 80.0–99.0)
MONOCYTES: 5 % (ref 5–13)
MPV: 10 FL (ref 8.9–12.9)
NEUTROPHILS: 86 % — ABNORMAL HIGH (ref 32–75)
NRBC: 0 PER 100 WBC
PLATELET: 235 10*3/uL (ref 150–400)
RBC: 4.4 M/uL (ref 4.10–5.70)
RDW: 12.8 % (ref 11.5–14.5)
WBC: 9.1 10*3/uL (ref 4.1–11.1)

## 2017-03-13 LAB — URINALYSIS W/ REFLEX CULTURE
Bacteria: NEGATIVE /hpf
Blood: NEGATIVE
Glucose: NEGATIVE mg/dL
Nitrites: NEGATIVE
Specific gravity: 1.025 (ref 1.003–1.030)
Urobilinogen: 1 EU/dL (ref 0.2–1.0)
pH (UA): 5.5 (ref 5.0–8.0)

## 2017-03-13 LAB — BILIRUBIN, CONFIRM: Bilirubin UA, confirm: NEGATIVE

## 2017-03-13 LAB — METABOLIC PANEL, COMPREHENSIVE
A-G Ratio: 1 — ABNORMAL LOW (ref 1.1–2.2)
ALT (SGPT): 14 U/L (ref 12–78)
AST (SGOT): 11 U/L — ABNORMAL LOW (ref 15–37)
Albumin: 3.8 g/dL (ref 3.5–5.0)
Alk. phosphatase: 96 U/L (ref 45–117)
Anion gap: 5 mmol/L (ref 5–15)
BUN/Creatinine ratio: 12 (ref 12–20)
BUN: 16 MG/DL (ref 6–20)
Bilirubin, total: 0.7 MG/DL (ref 0.2–1.0)
CO2: 30 mmol/L (ref 21–32)
Calcium: 9.5 MG/DL (ref 8.5–10.1)
Chloride: 106 mmol/L (ref 97–108)
Creatinine: 1.3 MG/DL (ref 0.70–1.30)
GFR est AA: >60 mL/min/{1.73_m2} (ref >60)
GFR est non-AA: 57 mL/min/{1.73_m2} — ABNORMAL LOW (ref >60)
Globulin: 3.8 g/dL (ref 2.0–4.0)
Glucose: 126 mg/dL — ABNORMAL HIGH (ref 65–100)
Potassium: 4 mmol/L (ref 3.5–5.1)
Protein, total: 7.6 g/dL (ref 6.4–8.2)
Sodium: 141 mmol/L (ref 136–145)

## 2017-03-13 LAB — TROPONIN I: Troponin-I, Qt.: <0.05 ng/mL (ref <0.05)

## 2017-03-13 LAB — MAGNESIUM: Magnesium: 1.8 mg/dL (ref 1.6–2.4)

## 2017-03-13 MED ORDER — SODIUM CHLORIDE 0.9% BOLUS IV
0.9 % | Freq: Once | INTRAVENOUS | Status: AC
Start: 2017-03-13 — End: 2017-03-13
  Administered 2017-03-13: 18:00:00 via INTRAVENOUS

## 2017-03-13 MED FILL — SODIUM CHLORIDE 0.9 % IV: INTRAVENOUS | Qty: 1000

## 2017-03-13 NOTE — ED Notes (Signed)
Assumed pt care for task only.  Patient discharged to home at this time with self. Patient provided with written instructions and 0 prescriptions. All questions answered.

## 2017-03-13 NOTE — ED Notes (Signed)
Pt presents to the ED via EMS with c/o lightheadedness for a few hours. Pt reports walking home from church and feeling lightheaded so he sat down and called 911. Pt reports chronic left hip pain. Pt reports nausea. Pt denies chest pain and SOB. PT has a 18 gauge IV in his left AC. Pt CBG en route was 176. Pt en route oxygen saturation was 92 on room air so they placed him on 2L via nasal cannula. Pt oxygen saturation was 100 on 2L via nasal cannula so I reduced the oxygen to 1L via nasal cannula. Pt reports drinking plenty of water. Pt denies using oxygen at home.     Pt is alert and oriented. Pt skin is warm and dry. Pt is ambulatory with use of a cane.       Emergency Department Nursing Plan of Care       The Nursing Plan of Care is developed from the Nursing assessment and Emergency Department Attending provider initial evaluation.  The plan of care may be reviewed in the ???ED Provider note???.    The Plan of Care was developed with the following considerations:   Patient / Family readiness to learn indicated [id number]:verbalized understanding  Persons(s) to be included in education: patient  Barriers to Learning/Limitations:No    Signed     Edwena BundeKaitlin M Owens    03/13/2017   12:52 PM

## 2017-03-13 NOTE — ED Provider Notes (Signed)
EMERGENCY DEPARTMENT HISTORY AND PHYSICAL EXAM      Date: 03/13/2017  Patient Name: Ronald Kennedy    History of Presenting Illness     Chief Complaint   Patient presents with   ??? Dizziness       History Provided By: Patient    HPI: Ronald Kennedy, 56 y.o. male with no significant PMHx, presents via EMS to the ED with cc of gradual onset lightheadedness, dizziness and nausea that began after leaving church 1 hour PTA. Pt reports that he was walking home from church and began to feeling lightheaded and sat down on a curb. He notes that he sat for a few minutes but upon standing back up he began to feel dizzy and nauseated and fell down to the ground. Pt denies hitting his head or any LOC. He states that he believes he has been drinking enough fluid, noting that he has been drinking "at least half a gallon a day." Pt also reports mild SOB with the dizziness. He notes that he has had a cardiac stress test "years ago", but unsure when. He denies any recent sickness or sick contact. He denies any abdominal or back surgeries or hx of blood clots or stroke. Pt specifically denies any fevers, chills, emesis, diarrhea, CP, HA, dysuria or hematuria.    There are no other complaints, changes, or physical findings at this time.    PCP: Phys Other, MD  Social Hx: + EtOH; - Smoker; - Illicit Drugs    Past History     Past Medical History:  History reviewed. No pertinent past medical history.    Past Surgical History:  Past Surgical History:   Procedure Laterality Date   ??? HX ORTHOPAEDIC      left knee for torn ligament       Family History:  History reviewed. No pertinent family history.    Social History:  Social History   Substance Use Topics   ??? Smoking status: Never Smoker   ??? Smokeless tobacco: Never Used   ??? Alcohol use Yes      Comment: occ       Allergies:  No Known Allergies      Review of Systems   Review of Systems   Constitutional: Negative.  Negative for chills and fever.    HENT: Negative.  Negative for congestion, rhinorrhea and sinus pressure.    Eyes: Negative.  Negative for discharge and redness.   Respiratory: Negative.  Negative for chest tightness and shortness of breath.    Cardiovascular: Negative.  Negative for chest pain.   Gastrointestinal: Negative.  Negative for abdominal pain and blood in stool.   Endocrine: Negative.    Genitourinary: Negative.  Negative for flank pain and hematuria.   Musculoskeletal: Negative.  Negative for back pain.   Skin: Negative.  Negative for rash.   Neurological: Negative.  Negative for dizziness, seizures, weakness, numbness and headaches.   Hematological: Negative.    All other systems reviewed and are negative.      Physical Exam   Physical Exam   Constitutional: He is oriented to person, place, and time. He appears well-developed and well-nourished. No distress.   HENT:   Head: Normocephalic and atraumatic.   Nose: Nose normal.   Mouth/Throat: No oropharyngeal exudate.   Eyes: Conjunctivae and EOM are normal. Pupils are equal, round, and reactive to light. No scleral icterus.   Neck: Normal range of motion. Neck supple. No JVD present. No thyromegaly present.   Cardiovascular:  Normal rate, regular rhythm, normal heart sounds, intact distal pulses and normal pulses.  PMI is not displaced.  Exam reveals no gallop and no friction rub.    No murmur heard.  Pulmonary/Chest: Effort normal and breath sounds normal. No stridor. No respiratory distress. He has no decreased breath sounds. He has no wheezes. He has no rhonchi. He has no rales. He exhibits no tenderness.   Abdominal: Soft. Normal aorta and bowel sounds are normal. He exhibits no distension, no abdominal bruit and no mass. There is no hepatosplenomegaly. There is no tenderness. There is no rebound, no guarding and no CVA tenderness. No hernia.   Musculoskeletal: He exhibits no edema or tenderness.   Neurological: He is alert and oriented to person, place, and time. He has  normal strength and normal reflexes. He displays no atrophy and no tremor. No cranial nerve deficit or sensory deficit. He exhibits normal muscle tone. He displays no seizure activity. Coordination normal. GCS eye subscore is 4. GCS verbal subscore is 5. GCS motor subscore is 6.   Reflex Scores:       Patellar reflexes are 2+ on the right side and 2+ on the left side.  Skin: Skin is warm. No rash noted. He is not diaphoretic. No erythema.   Nursing note and vitals reviewed.      Diagnostic Study Results     Labs -     Recent Results (from the past 12 hour(s))   CBC WITH AUTOMATED DIFF    Collection Time: 03/13/17  1:19 PM   Result Value Ref Range    WBC 9.1 4.1 - 11.1 K/uL    RBC 4.40 4.10 - 5.70 M/uL    HGB 13.8 12.1 - 17.0 g/dL    HCT 39.7 36.6 - 50.3 %    MCV 90.2 80.0 - 99.0 FL    MCH 31.4 26.0 - 34.0 PG    MCHC 34.8 30.0 - 36.5 g/dL    RDW 12.8 11.5 - 14.5 %    PLATELET 235 150 - 400 K/uL    MPV 10.0 8.9 - 12.9 FL    NRBC 0.0 0 PER 100 WBC    ABSOLUTE NRBC 0.00 0.00 - 0.01 K/uL    NEUTROPHILS 86 (H) 32 - 75 %    LYMPHOCYTES 9 (L) 12 - 49 %    MONOCYTES 5 5 - 13 %    EOSINOPHILS 0 0 - 7 %    BASOPHILS 0 0 - 1 %    IMMATURE GRANULOCYTES 0 0.0 - 0.5 %    ABS. NEUTROPHILS 7.8 1.8 - 8.0 K/UL    ABS. LYMPHOCYTES 0.8 0.8 - 3.5 K/UL    ABS. MONOCYTES 0.5 0.0 - 1.0 K/UL    ABS. EOSINOPHILS 0.0 0.0 - 0.4 K/UL    ABS. BASOPHILS 0.0 0.0 - 0.1 K/UL    ABS. IMM. GRANS. 0.0 0.00 - 0.04 K/UL    DF SMEAR SCANNED      RBC COMMENTS NORMOCYTIC, NORMOCHROMIC     METABOLIC PANEL, COMPREHENSIVE    Collection Time: 03/13/17  1:19 PM   Result Value Ref Range    Sodium 141 136 - 145 mmol/L    Potassium 4.0 3.5 - 5.1 mmol/L    Chloride 106 97 - 108 mmol/L    CO2 30 21 - 32 mmol/L    Anion gap 5 5 - 15 mmol/L    Glucose 126 (H) 65 - 100 mg/dL    BUN 16 6 - 20 MG/DL  Creatinine 1.30 0.70 - 1.30 MG/DL    BUN/Creatinine ratio 12 12 - 20      GFR est AA >60 >60 ml/min/1.75m    GFR est non-AA 57 (L) >60 ml/min/1.772m    Calcium 9.5 8.5 - 10.1 MG/DL    Bilirubin, total 0.7 0.2 - 1.0 MG/DL    ALT (SGPT) 14 12 - 78 U/L    AST (SGOT) 11 (L) 15 - 37 U/L    Alk. phosphatase 96 45 - 117 U/L    Protein, total 7.6 6.4 - 8.2 g/dL    Albumin 3.8 3.5 - 5.0 g/dL    Globulin 3.8 2.0 - 4.0 g/dL    A-G Ratio 1.0 (L) 1.1 - 2.2     URINALYSIS W/ REFLEX CULTURE    Collection Time: 03/13/17  1:19 PM   Result Value Ref Range    Color DARK YELLOW      Appearance CLOUDY (A) CLEAR      Specific gravity 1.025 1.003 - 1.030      pH (UA) 5.5 5.0 - 8.0      Protein TRACE (A) NEG mg/dL    Glucose NEGATIVE  NEG mg/dL    Ketone TRACE (A) NEG mg/dL    Blood NEGATIVE  NEG      Urobilinogen 1.0 0.2 - 1.0 EU/dL    Nitrites NEGATIVE  NEG      Leukocyte Esterase TRACE (A) NEG      WBC 0-4 0 - 4 /hpf    RBC 0-5 0 - 5 /hpf    Epithelial cells FEW FEW /lpf    Bacteria NEGATIVE  NEG /hpf    UA:UC IF INDICATED CULTURE NOT INDICATED BY UA RESULT CNI     TROPONIN I    Collection Time: 03/13/17  1:19 PM   Result Value Ref Range    Troponin-I, Qt. <0.05 <0.05 ng/mL   MAGNESIUM    Collection Time: 03/13/17  1:19 PM   Result Value Ref Range    Magnesium 1.8 1.6 - 2.4 mg/dL   BILIRUBIN, CONFIRM    Collection Time: 03/13/17  1:19 PM   Result Value Ref Range    Bilirubin UA, confirm NEGATIVE  NEG         Radiologic Studies -   XR CHEST PORT   Final Result        CT Results  (Last 48 hours)    None        CXR Results  (Last 48 hours)               03/13/17 1338  XR CHEST PORT Final result    Impression:  IMPRESSION:    No pneumonia or CHF.               Narrative:  EXAM:  XR CHEST PORT       INDICATION:  Dizziness. Weakness.       COMPARISON:  No old study.       FINDINGS:     AP portable views were obtained of the chest.     The heart is normal in size.    Slight linear stranding at the right lung base is seen..     No consolidation or pulmonary edema is evident.     Degenerative change of the spine is noted.                     Medical Decision Making    I am the first  provider for this patient.    I reviewed the vital signs, available nursing notes, past medical history, past surgical history, family history and social history.    Vital Signs-Reviewed the patient's vital signs.  Patient Vitals for the past 12 hrs:   Temp Pulse Resp BP SpO2   03/13/17 1359 - 64 16 117/79 99 %   03/13/17 1328 - - - 104/67 99 %   03/13/17 1327 - - - 117/69 99 %   03/13/17 1325 - - - - 100 %   03/13/17 1241 98.3 ??F (36.8 ??C) 66 18 105/66 100 %       Pulse Oximetry Analysis - 100% on RA    Cardiac Monitor:   Rate: 66 bpm  Rhythm: Normal Sinus Rhythm      EKG interpretation: (Preliminary)1252  Rhythm: normal sinus rhythm; and regular . Rate (approx.): 68; Axis: normal; PR interval: normal; QRS interval: normal ; ST/T wave: non-specific ST abnormality; Other findings: normal.  Written by Lanora Manis, ED Scribe, as dictated by Alvie Heidelberg, MD.    Records Reviewed: Nursing Notes and Old Medical Records    Provider Notes (Medical Decision Making):   DDx: dehydration, arrhythmia, electro ab, anemia, alcohol related complication, coronary syndrom    Impression Plan: Healthy male with general dizziness starting while walking down the street. No other complaints including CP, abd pain and HA. BP is borderline low so will give IV fluids, suspecting dehydration period. If labs normal and sxs improved will plan to discharge.    ED Course:   Initial assessment performed. The patients presenting problems have been discussed, and they are in agreement with the care plan formulated and outlined with them.  I have encouraged them to ask questions as they arise throughout their visit.    Progress Note:  2:45 PM  Much improved after fluids. BP much improved. No longer dizzy    Critical Care Time:   0 minutes    Disposition:  Discharge Note:  2:45 PM  The pt is ready for discharge. The pt's signs, symptoms, diagnosis, and discharge instructions have been discussed and pt has conveyed their  understanding. The pt is to follow up as recommended or return to ER should their symptoms worsen. Plan has been discussed and pt is in agreement.    PLAN:  1. There are no discharge medications for this patient.    2.   Follow-up Information     Follow up With Details Comments Contact Info    Phys Other, MD Schedule an appointment as soon as possible for a visit in 1 day  Patient can only remember the practice name and not the physician      [facility] EMERGENCY DEPT  If symptoms worsen [place]  [phone]        Return to ED if worse     Diagnosis     Clinical Impression:   1. Dizziness    2. Dehydration        Attestations:    This note is prepared by Micayla L. Mamie Levers, acting as Education administrator for Alvie Heidelberg, MD.    Alvie Heidelberg, MD: The scribe's documentation has been prepared under my direction and personally reviewed by me in its entirety. I confirm that the note above accurately reflects all work, treatment, procedures, and medical decision making performed by me.

## 2017-03-13 NOTE — ED Notes (Signed)
 Formatting of this note is different from the original.  Pt presents to the ED via EMS with c/o lightheadedness for a few hours. Pt reports walking home from church and feeling lightheaded so he sat down and called 911. Pt reports chronic left hip pain. Pt reports nausea. Pt denies chest pain and SOB. PT has a 18 gauge IV in his left AC. Pt CBG en route was 176. Pt en route oxygen saturation was 92 on room air so they placed him on 2L via nasal cannula. Pt oxygen saturation was 100 on 2L via nasal cannula so I reduced the oxygen to 1L via nasal cannula. Pt reports drinking plenty of water. Pt denies using oxygen at home.     Pt is alert and oriented. Pt skin is warm and dry. Pt is ambulatory with use of a cane.     Emergency Department Nursing Plan of Care     The Nursing Plan of Care is developed from the Nursing assessment and Emergency Department Attending provider initial evaluation.  The plan of care may be reviewed in the ?ED Provider note?[name]    The Plan of Care was developed with the following considerations:   Patient / Family readiness to learn indicated [id number] understanding  Persons(s) to be included in education: patient  Barriers to Learning/Limitations:No    Signed     Kaitlin M Owens    03/13/2017   12:52 PM    Electronically signed by Owens, Kaitlin M at 03/13/2017 12:52 PM EDT

## 2017-03-13 NOTE — ED Notes (Signed)
 Formatting of this note might be different from the original.  Assumed pt care for task only.  Patient discharged to home at this time with self. Patient provided with written instructions and 0 prescriptions. All questions answered.    Electronically signed by Mason Sole, RN at 03/13/2017  3:05 PM EDT

## 2017-03-13 NOTE — ED Provider Notes (Signed)
 Formatting of this note is different from the original.  Images from the original note were not included.  EMERGENCY DEPARTMENT HISTORY AND PHYSICAL EXAM    Date: 03/13/2017  Patient Name: Ronald Kennedy    History of Presenting Illness     Chief Complaint   Patient presents with   ? Dizziness     History Provided By: Patient    HPI: Ronald Kennedy, 56 y.o. male with no significant PMHx, presents via EMS to the ED with cc of gradual onset lightheadedness, dizziness and nausea that began after leaving church 1 hour PTA. Pt reports that he was walking home from church and began to feeling lightheaded and sat down on a curb. He notes that he sat for a few minutes but upon standing back up he began to feel dizzy and nauseated and fell down to the ground. Pt denies hitting his head or any LOC. He states that he believes he has been drinking enough fluid, noting that he has been drinking "at least half a gallon a day." Pt also reports mild SOB with the dizziness. He notes that he has had a cardiac stress test "years ago", but unsure when. He denies any recent sickness or sick contact. He denies any abdominal or back surgeries or hx of blood clots or stroke. Pt specifically denies any fevers, chills, emesis, diarrhea, CP, HA, dysuria or hematuria.    There are no other complaints, changes, or physical findings at this time.    PCP: Phys Other, MD  Social Hx: + EtOH; - Smoker; - Illicit Drugs    Past History     Past Medical History:  History reviewed. No pertinent past medical history.    Past Surgical History:  Past Surgical History:   Procedure Laterality Date   ? HX ORTHOPAEDIC      left knee for torn ligament     Family History:  History reviewed. No pertinent family history.    Social History:  Social History   Substance Use Topics   ? Smoking status: Never Smoker   ? Smokeless tobacco: Never Used   ? Alcohol use Yes      Comment: occ     Allergies:  No Known Allergies    Review of Systems   Review of Systems    Constitutional: Negative.  Negative for chills and fever.   HENT: Negative.  Negative for congestion, rhinorrhea and sinus pressure.    Eyes: Negative.  Negative for discharge and redness.   Respiratory: Negative.  Negative for chest tightness and shortness of breath.    Cardiovascular: Negative.  Negative for chest pain.   Gastrointestinal: Negative.  Negative for abdominal pain and blood in stool.   Endocrine: Negative.    Genitourinary: Negative.  Negative for flank pain and hematuria.   Musculoskeletal: Negative.  Negative for back pain.   Skin: Negative.  Negative for rash.   Neurological: Negative.  Negative for dizziness, seizures, weakness, numbness and headaches.   Hematological: Negative.    All other systems reviewed and are negative.    Physical Exam   Physical Exam   Constitutional: He is oriented to person, place, and time. He appears well-developed and well-nourished. No distress.   HENT:   Head: Normocephalic and atraumatic.   Nose: Nose normal.   Mouth/Throat: No oropharyngeal exudate.   Eyes: Conjunctivae and EOM are normal. Pupils are equal, round, and reactive to light. No scleral icterus.   Neck: Normal range of motion. Neck supple. No JVD  present. No thyromegaly present.   Cardiovascular: Normal rate, regular rhythm, normal heart sounds, intact distal pulses and normal pulses.  PMI is not displaced.  Exam reveals no gallop and no friction rub.    No murmur heard.  Pulmonary/Chest: Effort normal and breath sounds normal. No stridor. No respiratory distress. He has no decreased breath sounds. He has no wheezes. He has no rhonchi. He has no rales. He exhibits no tenderness.   Abdominal: Soft. Normal aorta and bowel sounds are normal. He exhibits no distension, no abdominal bruit and no mass. There is no hepatosplenomegaly. There is no tenderness. There is no rebound, no guarding and no CVA tenderness. No hernia.   Musculoskeletal: He exhibits no edema or tenderness.   Neurological: He is alert  and oriented to person, place, and time. He has normal strength and normal reflexes. He displays no atrophy and no tremor. No cranial nerve deficit or sensory deficit. He exhibits normal muscle tone. He displays no seizure activity. Coordination normal. GCS eye subscore is 4. GCS verbal subscore is 5. GCS motor subscore is 6.   Reflex Scores:       Patellar reflexes are 2+ on the right side and 2+ on the left side.  Skin: Skin is warm. No rash noted. He is not diaphoretic. No erythema.   Nursing note and vitals reviewed.    Diagnostic Study Results     Labs -    Recent Results (from the past 12 hour(s))   CBC WITH AUTOMATED DIFF    Collection Time: 03/13/17  1:19 PM   Result Value Ref Range    WBC 9.1 4.1 - 11.1 K/uL    RBC 4.40 4.10 - 5.70 M/uL    HGB 13.8 12.1 - 17.0 g/dL    HCT 16.1 09.6 - 04.5 %    MCV 90.2 80.0 - 99.0 FL    MCH 31.4 26.0 - 34.0 PG    MCHC 34.8 30.0 - 36.5 g/dL    RDW 40.9 81.1 - 91.4 %    PLATELET 235 150 - 400 K/uL    MPV 10.0 8.9 - 12.9 FL    NRBC 0.0 0 PER 100 WBC    ABSOLUTE NRBC 0.00 0.00 - 0.01 K/uL    NEUTROPHILS 86 (H) 32 - 75 %    LYMPHOCYTES 9 (L) 12 - 49 %    MONOCYTES 5 5 - 13 %    EOSINOPHILS 0 0 - 7 %    BASOPHILS 0 0 - 1 %    IMMATURE GRANULOCYTES 0 0.0 - 0.5 %    ABS. NEUTROPHILS 7.8 1.8 - 8.0 K/UL    ABS. LYMPHOCYTES 0.8 0.8 - 3.5 K/UL    ABS. MONOCYTES 0.5 0.0 - 1.0 K/UL    ABS. EOSINOPHILS 0.0 0.0 - 0.4 K/UL    ABS. BASOPHILS 0.0 0.0 - 0.1 K/UL    ABS. IMM. GRANS. 0.0 0.00 - 0.04 K/UL    DF SMEAR SCANNED      RBC COMMENTS NORMOCYTIC, NORMOCHROMIC     METABOLIC PANEL, COMPREHENSIVE    Collection Time: 03/13/17  1:19 PM   Result Value Ref Range    Sodium 141 136 - 145 mmol/L    Potassium 4.0 3.5 - 5.1 mmol/L    Chloride 106 97 - 108 mmol/L    CO2 30 21 - 32 mmol/L    Anion gap 5 5 - 15 mmol/L    Glucose 126 (H) 65 - 100 mg/dL    BUN 16  6 - 20 MG/DL    Creatinine 0.45 4.09 - 1.30 MG/DL    BUN/Creatinine ratio 12 12 - 20      GFR est AA >60 >60 ml/min/1.85m2    GFR est non-AA 57  (L) >60 ml/min/1.74m2    Calcium 9.5 8.5 - 10.1 MG/DL    Bilirubin, total 0.7 0.2 - 1.0 MG/DL    ALT (SGPT) 14 12 - 78 U/L    AST (SGOT) 11 (L) 15 - 37 U/L    Alk. phosphatase 96 45 - 117 U/L    Protein, total 7.6 6.4 - 8.2 g/dL    Albumin 3.8 3.5 - 5.0 g/dL    Globulin 3.8 2.0 - 4.0 g/dL    A-G Ratio 1.0 (L) 1.1 - 2.2     URINALYSIS W/ REFLEX CULTURE    Collection Time: 03/13/17  1:19 PM   Result Value Ref Range    Color DARK YELLOW      Appearance CLOUDY (A) CLEAR      Specific gravity 1.025 1.003 - 1.030      pH (UA) 5.5 5.0 - 8.0      Protein TRACE (A) NEG mg/dL    Glucose NEGATIVE  NEG mg/dL    Ketone TRACE (A) NEG mg/dL    Blood NEGATIVE  NEG      Urobilinogen 1.0 0.2 - 1.0 EU/dL    Nitrites NEGATIVE  NEG      Leukocyte Esterase TRACE (A) NEG      WBC 0-4 0 - 4 /hpf    RBC 0-5 0 - 5 /hpf    Epithelial cells FEW FEW /lpf    Bacteria NEGATIVE  NEG /hpf    UA:UC IF INDICATED CULTURE NOT INDICATED BY UA RESULT CNI     TROPONIN I    Collection Time: 03/13/17  1:19 PM   Result Value Ref Range    Troponin-I, Qt. <0.05 <0.05 ng/mL   MAGNESIUM    Collection Time: 03/13/17  1:19 PM   Result Value Ref Range    Magnesium 1.8 1.6 - 2.4 mg/dL   BILIRUBIN, CONFIRM    Collection Time: 03/13/17  1:19 PM   Result Value Ref Range    Bilirubin UA, confirm NEGATIVE  NEG       Radiologic Studies -   XR CHEST PORT   Final Result       CT Results  (Last 48 hours)    None       CXR Results  (Last 48 hours)      03/13/17 1338  XR CHEST PORT Final result    Impression:  IMPRESSION:    No pneumonia or CHF.          Narrative:  EXAM:  XR CHEST PORT     INDICATION:  Dizziness. Weakness.     COMPARISON:  No old study.     FINDINGS:     AP portable views were obtained of the chest.     The heart is normal in size.    Slight linear stranding at the right lung base is seen..     No consolidation or pulmonary edema is evident.     Degenerative change of the spine is noted.             Medical Decision Making   I am the first provider for this  patient.    I reviewed the vital signs, available nursing notes, past medical history, past surgical history, family history and social  history.    Vital Signs-Reviewed the patient's vital signs.  Patient Vitals for the past 12 hrs:   Temp Pulse Resp BP SpO2   03/13/17 1359 - 64 16 117/79 99 %   03/13/17 1328 - - - 104/67 99 %   03/13/17 1327 - - - 117/69 99 %   03/13/17 1325 - - - - 100 %   03/13/17 1241 98.3 F (36.8 C) 66 18 105/66 100 %     Pulse Oximetry Analysis - 100% on RA    Cardiac Monitor:   Rate: 66 bpm  Rhythm: Normal Sinus Rhythm      EKG interpretation: (Preliminary)1252  Rhythm: normal sinus rhythm; and regular . Rate (approx.): 68; Axis: normal; PR interval: normal; QRS interval: normal ; ST/T wave: non-specific ST abnormality; Other findings: normal.  Written by Alger Infield, ED Scribe, as dictated by Dwayne E Stratton, MD.    Records Reviewed: Nursing Notes and Old Medical Records    Provider Notes (Medical Decision Making):   DDx: dehydration, arrhythmia, electro ab, anemia, alcohol related complication, coronary syndrom    Impression Plan: Healthy male with general dizziness starting while walking down the street. No other complaints including CP, abd pain and HA. BP is borderline low so will give IV fluids, suspecting dehydration period. If labs normal and sxs improved will plan to discharge.    ED Course:   Initial assessment performed. The patients presenting problems have been discussed, and they are in agreement with the care plan formulated and outlined with them.  I have encouraged them to ask questions as they arise throughout their visit.    Progress Note:  2:45 PM  Much improved after fluids. BP much improved. No longer dizzy    Critical Care Time:   0 minutes    Disposition:  Discharge Note:  2:45 PM  The pt is ready for discharge. The pt's signs, symptoms, diagnosis, and discharge instructions have been discussed and pt has conveyed their understanding. The pt is to follow up as  recommended or return to ER should their symptoms worsen. Plan has been discussed and pt is in agreement.    PLAN:  1. There are no discharge medications for this patient.    2.   Follow-up Information     Follow up With Details Comments Contact Info    Phys Other, MD Schedule an appointment as soon as possible for a visit in 1 day  Patient can only remember the practice name and not the physician     [facility] EMERGENCY DEPT  If symptoms worsen [address]   [place]  [id number]  [phone]       Return to ED if worse     Diagnosis     Clinical Impression:   1. Dizziness    2. Dehydration      Attestations:    This note is prepared by Micayla L. Annye Kinds, acting as Neurosurgeon for Dwayne E Stratton, MD.    Janese Medicine, MD: The scribe's documentation has been prepared under my direction and personally reviewed by me in its entirety. I confirm that the note above accurately reflects all work, treatment, procedures, and medical decision making performed by me.      Electronically signed by Janese Medicine, MD at 03/15/2017 11:57 AM EDT

## 2017-03-14 LAB — EKG 12-LEAD
Atrial Rate: 68 {beats}/min
Diagnosis: NORMAL
P Axis: 75 degrees
P-R Interval: 148 ms
Q-T Interval: 408 ms
QRS Duration: 100 ms
QTc Calculation (Bazett): 433 ms
R Axis: 74 degrees
T Axis: 61 degrees
Ventricular Rate: 68 {beats}/min

## 2017-03-14 LAB — EKG, 12 LEAD, INITIAL
Atrial Rate: 68 {beats}/min
Calculated P Axis: 75 degrees
Calculated R Axis: 74 degrees
Calculated T Axis: 61 degrees
Diagnosis: NORMAL
P-R Interval: 148 ms
Q-T Interval: 408 ms
QRS Duration: 100 ms
QTC Calculation (Bezet): 433 ms
Ventricular Rate: 68 {beats}/min

## 2017-12-28 IMAGING — CR DG KNEE COMPLETE 4+V*R*
4 series · 4 of 4 positions shown · non-contrast
Comparison: None.

CLINICAL DATA: Right knee pain.  No reported injury.

EXAM:
RIGHT KNEE - COMPLETE 4+ VIEW

[t knee ap right]
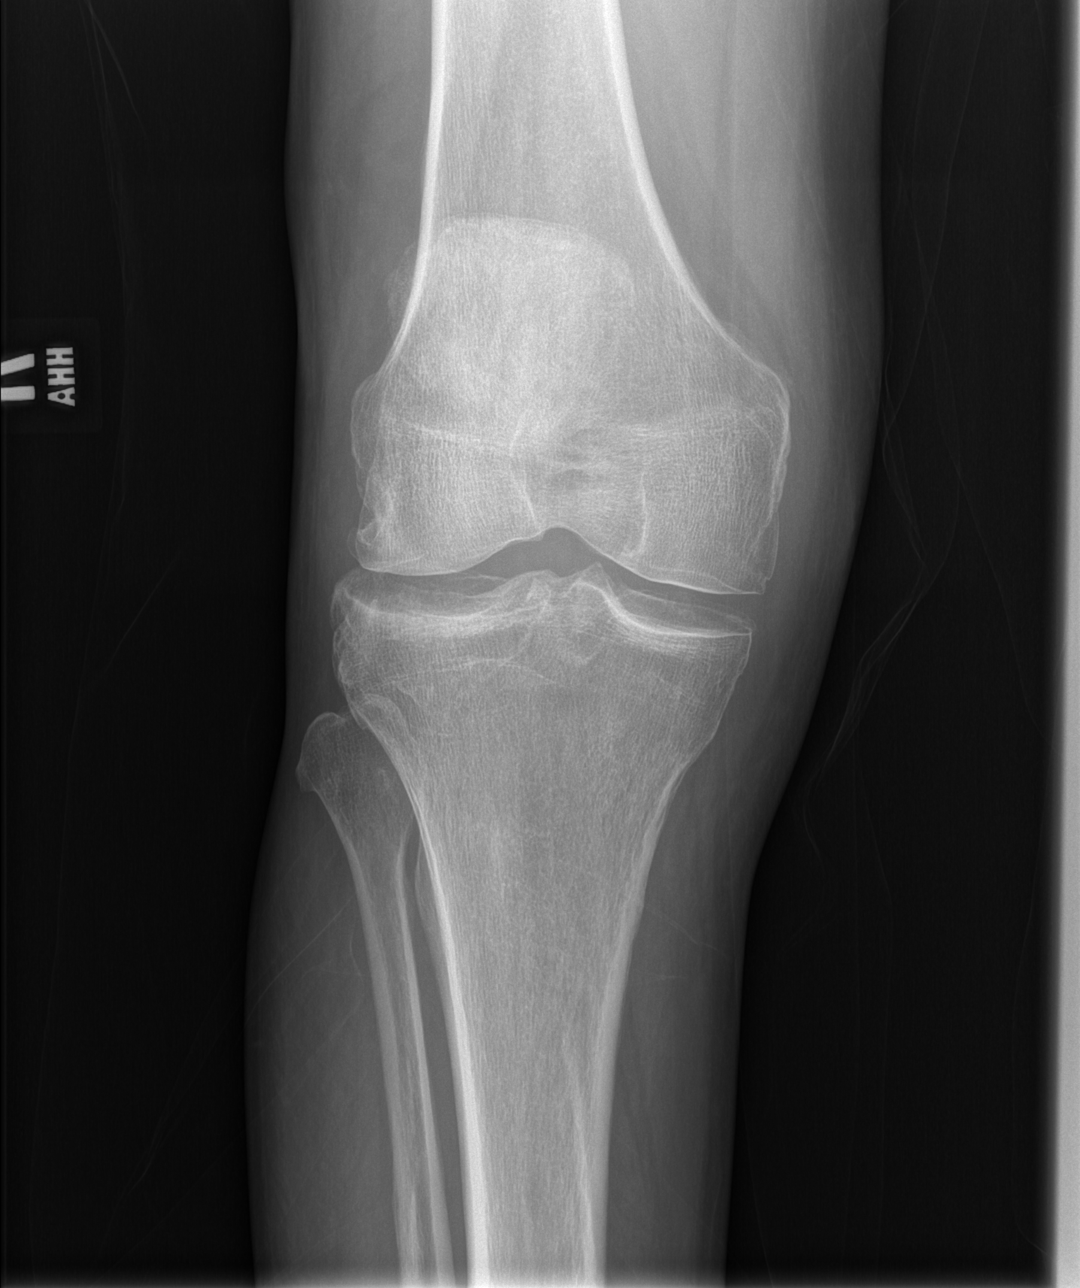

[t knee obl right (1 of 2)]
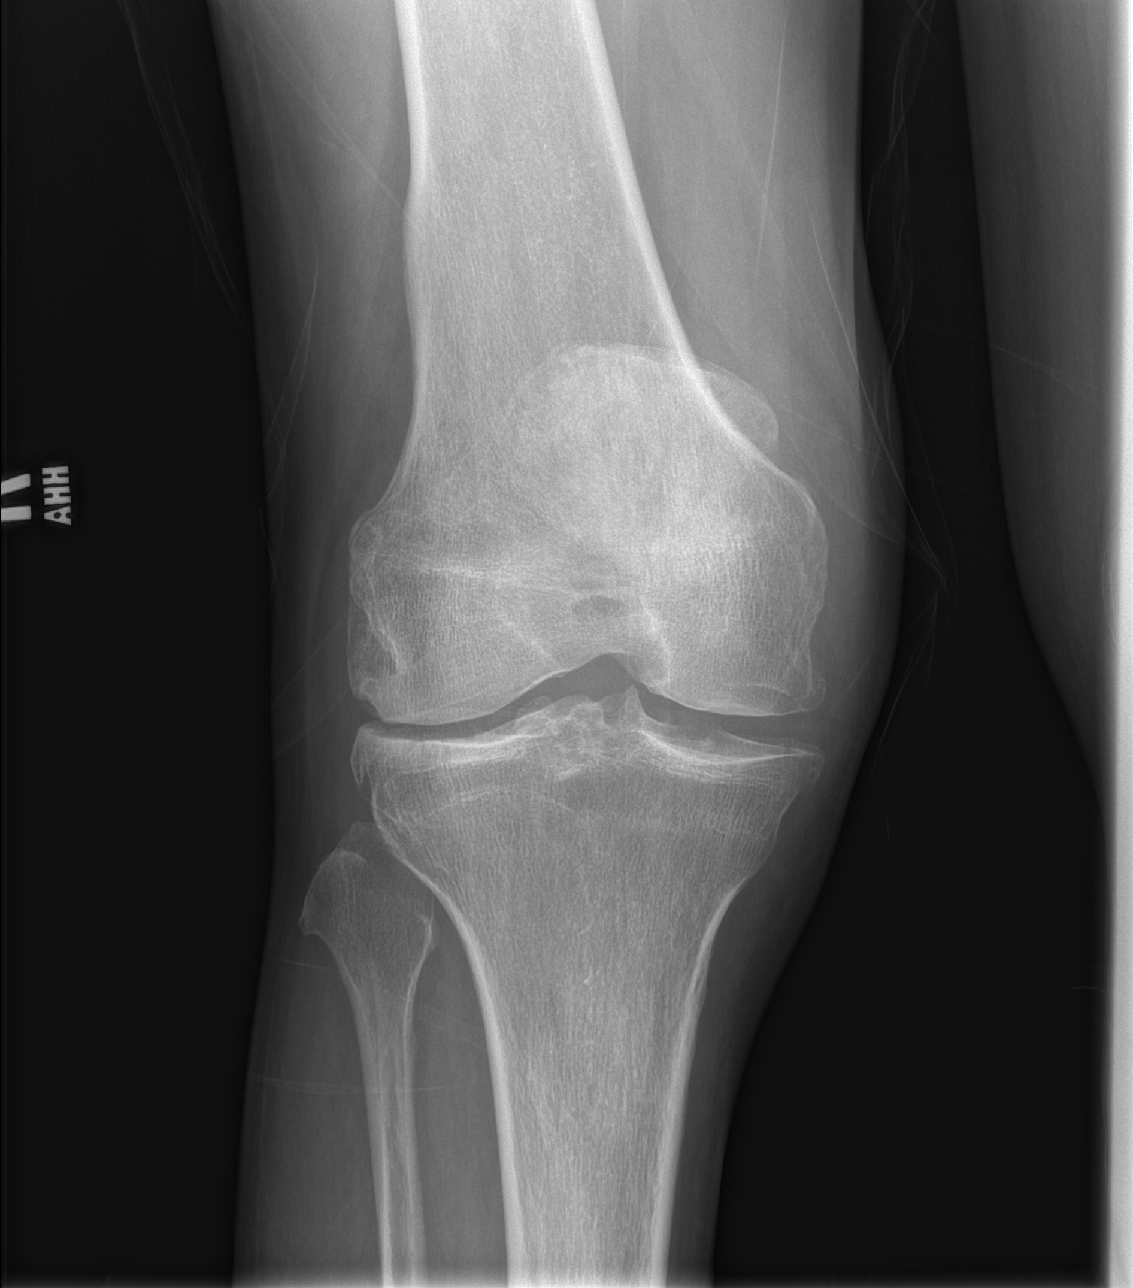

[t knee obl right (2 of 2)]
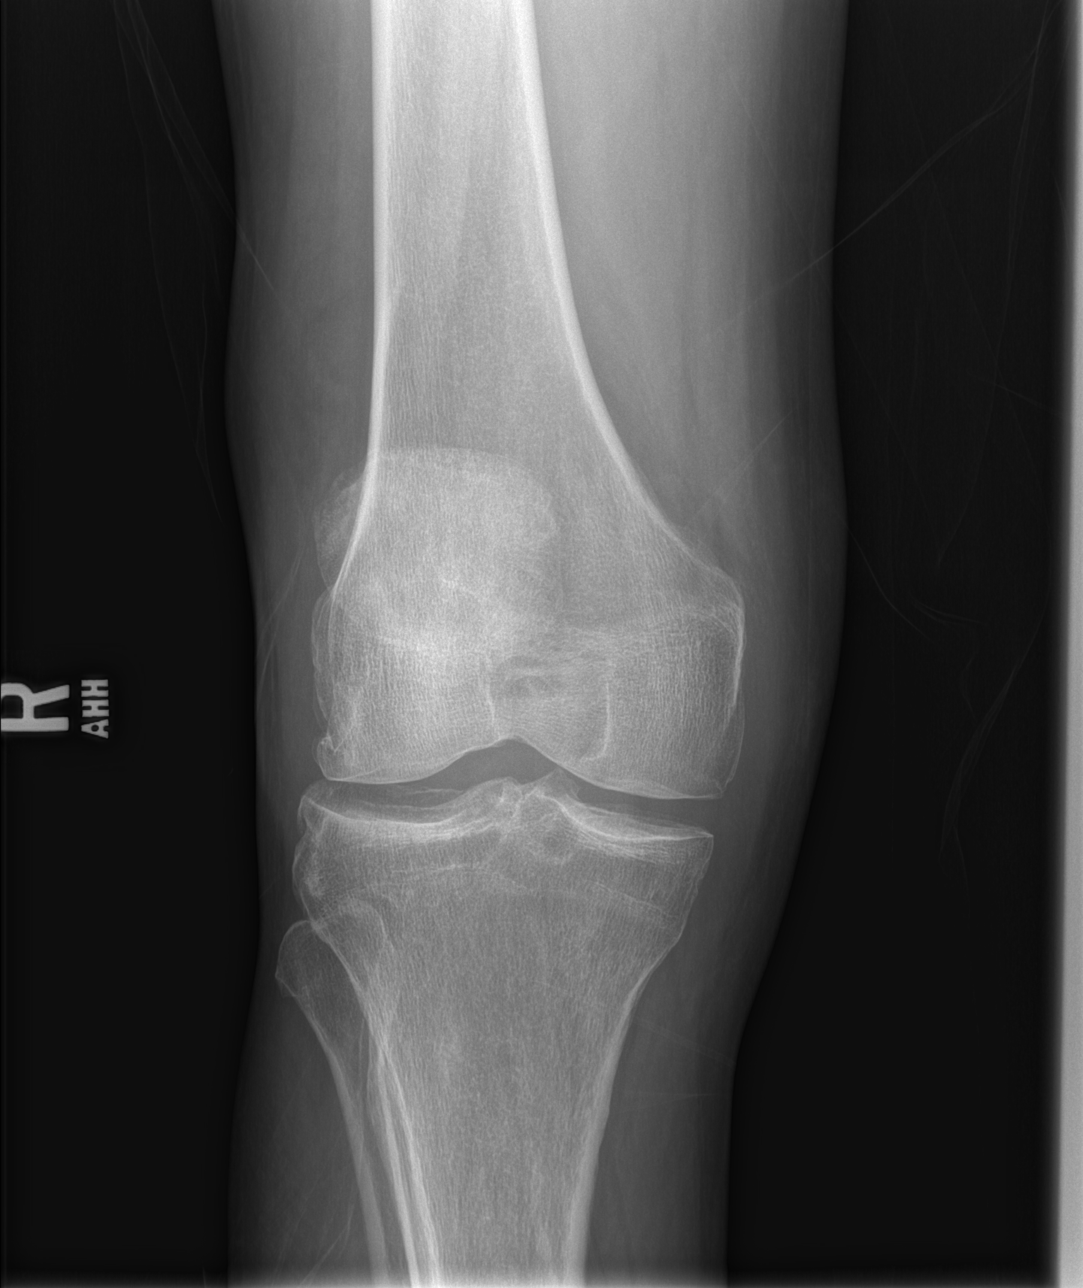

[t knee lat right]
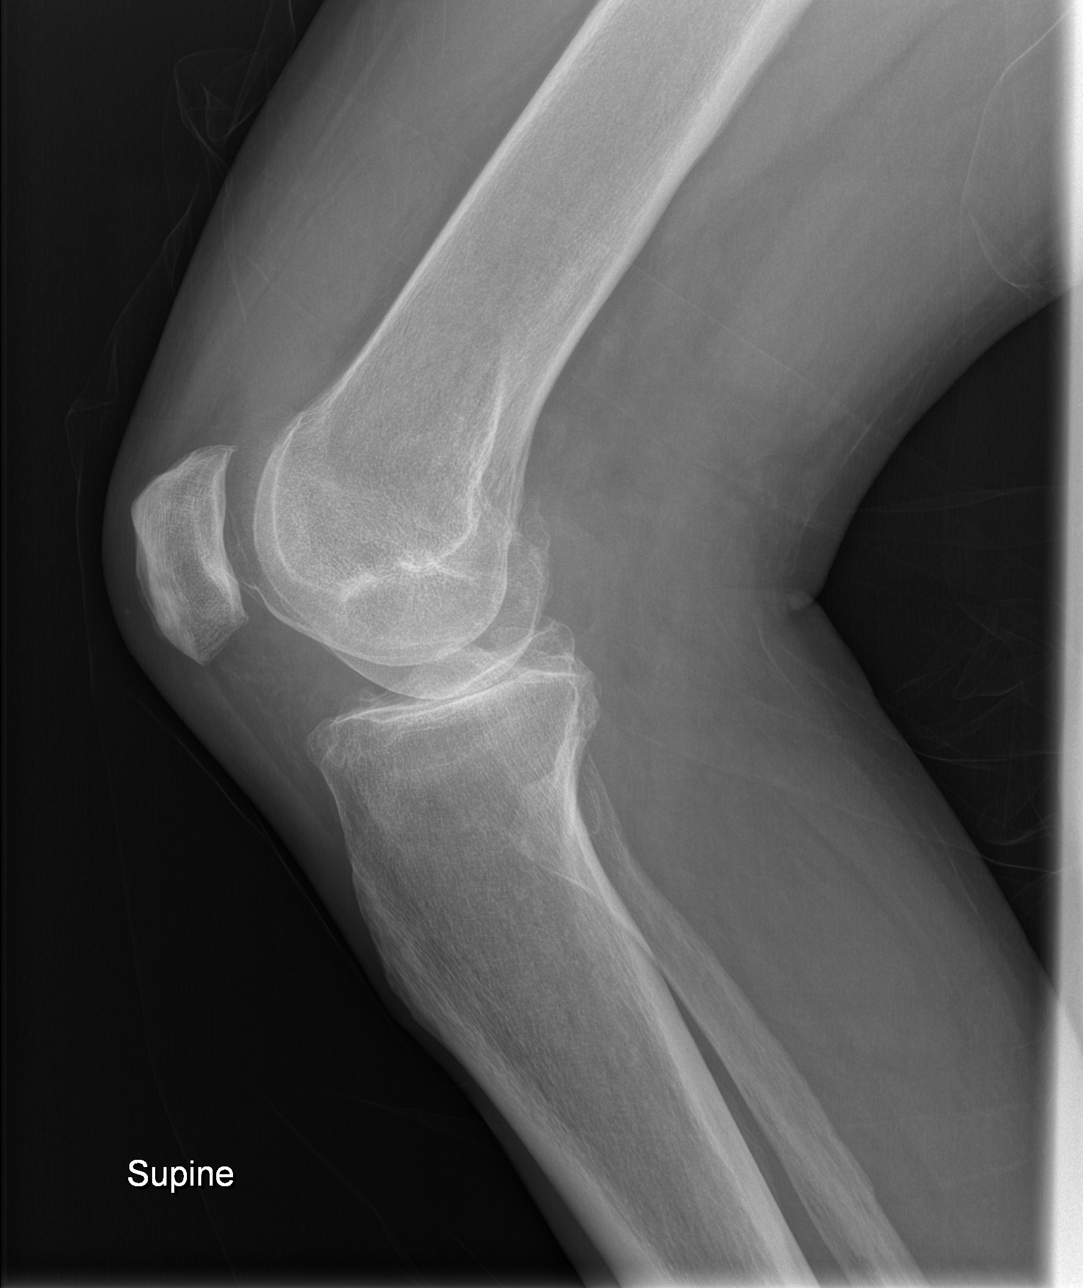

[4 of 4 positions shown; findings below may reference images not displayed]

FINDINGS: Small suprapatellar right knee joint effusion. No fracture,
dislocation or suspicious focal osseous lesion. Mild-to-moderate
tricompartmental osteoarthritis, most prominent in the lateral
compartment.
IMPRESSION: 1. No fracture or malalignment.
2. Small suprapatellar right knee joint effusion.
3. Mild-to-moderate tricompartmental right knee osteoarthritis, most
prominent in the lateral compartment.

## 2020-06-01 ENCOUNTER — Inpatient Hospital Stay
Admit: 2020-06-01 | Discharge: 2020-06-05 | Disposition: A | Discharge status: Home / Self Care | Payer: MEDICARE | Attending: Internal Medicine | Admitting: Internal Medicine

## 2020-06-01 ENCOUNTER — Emergency Department: Admit: 2020-06-01 | Payer: MEDICARE | Primary: Family Medicine

## 2020-06-01 DIAGNOSIS — I2699 Other pulmonary embolism without acute cor pulmonale: Secondary | ICD-10-CM

## 2020-06-01 LAB — CBC WITH AUTO DIFFERENTIAL
Basophils %: 0 % (ref 0–1)
Basophils Absolute: 0 10*3/uL (ref 0.0–0.1)
Eosinophils %: 1 % (ref 0–7)
Eosinophils Absolute: 0.1 10*3/uL (ref 0.0–0.4)
Granulocyte Absolute Count: 0 10*3/uL (ref 0.00–0.04)
Hematocrit: 40.6 % (ref 36.6–50.3)
Hemoglobin: 13.3 g/dL (ref 12.1–17.0)
Immature Granulocytes: 0 % (ref 0.0–0.5)
Lymphocytes %: 12 % (ref 12–49)
Lymphocytes Absolute: 0.9 10*3/uL (ref 0.8–3.5)
MCH: 30 PG (ref 26.0–34.0)
MCHC: 32.8 g/dL (ref 30.0–36.5)
MCV: 91.6 FL (ref 80.0–99.0)
MPV: 9.4 FL (ref 8.9–12.9)
Monocytes %: 12 % (ref 5–13)
Monocytes Absolute: 0.8 10*3/uL (ref 0.0–1.0)
NRBC Absolute: 0 10*3/uL (ref 0.00–0.01)
Neutrophils %: 75 % (ref 32–75)
Neutrophils Absolute: 5.1 10*3/uL (ref 1.8–8.0)
Nucleated RBCs: 0 PER 100 WBC
Platelets: 158 10*3/uL (ref 150–400)
RBC: 4.43 M/uL (ref 4.10–5.70)
RDW: 13.4 % (ref 11.5–14.5)
WBC: 6.9 10*3/uL (ref 4.1–11.1)

## 2020-06-01 LAB — CBC WITH AUTOMATED DIFF
ABS. BASOPHILS: 0 10*3/uL (ref 0.0–0.1)
ABS. EOSINOPHILS: 0.1 10*3/uL (ref 0.0–0.4)
ABS. IMM. GRANS.: 0 10*3/uL (ref 0.00–0.04)
ABS. LYMPHOCYTES: 0.9 10*3/uL (ref 0.8–3.5)
ABS. MONOCYTES: 0.8 10*3/uL (ref 0.0–1.0)
ABS. NEUTROPHILS: 5.1 10*3/uL (ref 1.8–8.0)
ABSOLUTE NRBC: 0 10*3/uL (ref 0.00–0.01)
BASOPHILS: 0 % (ref 0–1)
EOSINOPHILS: 1 % (ref 0–7)
HCT: 40.6 % (ref 36.6–50.3)
HGB: 13.3 g/dL (ref 12.1–17.0)
IMMATURE GRANULOCYTES: 0 % (ref 0.0–0.5)
LYMPHOCYTES: 12 % (ref 12–49)
MCH: 30 PG (ref 26.0–34.0)
MCHC: 32.8 g/dL (ref 30.0–36.5)
MCV: 91.6 FL (ref 80.0–99.0)
MONOCYTES: 12 % (ref 5–13)
MPV: 9.4 FL (ref 8.9–12.9)
NEUTROPHILS: 75 % (ref 32–75)
NRBC: 0 PER 100 WBC
PLATELET: 158 10*3/uL (ref 150–400)
RBC: 4.43 M/uL (ref 4.10–5.70)
RDW: 13.4 % (ref 11.5–14.5)
WBC: 6.9 10*3/uL (ref 4.1–11.1)

## 2020-06-01 NOTE — ED Provider Notes (Signed)
ED  Provider Notes by Fredderick Erb, MD at 06/01/20 2039                Author: Fredderick Erb, MD  Service: Emergency Medicine  Author Type: Physician       Filed: 07/06/20 0454  Date of Service: 06/01/20 2039  Status: Addendum          Editor: Fredderick Erb, MD (Physician)          Related Notes: Original Note by Fredderick Erb, MD (Physician) filed at 06/06/20 0001                [place]           Please note that this dictation was completed with the assistance of "Dragon", the computer voice recognition software. Quite often unanticipated grammatical, syntax, homophones, and other interpretive  errors are inadvertently transcribed by the computer software. Please disregard these errors and any errors that have escaped final proofreading. Thank you.      Patient Name: Ronald Kennedy   DOB: 1961/08/11   MRN: [id number]     History of Presenting Illness          Chief Complaint       Patient presents with        ?  Palpitations             Patient brought in by EMS from the side of the road for palpitations and dizziness. Patient stated that he was also feeling short of breath and had  chest tightness. Patient's 12 lead for EMS was normal. Oxygen normal. Blood sugar 128 for EMS. They placed him on 2 liters via nasal cannula for comfort. Normally not on oxygen at home.        History Provided By: Patient      HPI: Ronald Kennedy,  59 y.o. male with past medical history as documented below presents to the ED with c/o of  acute SOB, dizziness and chest tightness. Pt was found on side of road as he c/o severe SOB and palpitations. He reported that he had 2 episodes of severe chest pain in the last 24 hours. He states chest pain is substernal, sharp and worse with movement.  Pt denies any other exacerbating or ameliorating factors. Additionally, pt specifically denies any recent fever, chills, headache, nausea, vomiting, abdominal pain, numbness, weakness, lower extremity swelling, heart  palpitations, urinary sxs, diarrhea,  constipation, melena, hematochezia, cough, or congestion.       There are no other complaints, changes or physical findings pertinent to the HPI at this time.      PCP: Other, Phys, MD        Past History     Past Medical History:   History reviewed. No pertinent past medical history.      Past Surgical History:     Past Surgical History:         Procedure  Laterality  Date          ?  HX ORTHOPAEDIC              left knee for torn ligament           Family History:   Family history reviewed and non-contributory   History reviewed. No pertinent family history.         Social History:     Social History          Tobacco Use         ?  Smoking status:  Never Smoker     ?  Smokeless tobacco:  Never Used       Substance Use Topics         ?  Alcohol use:  Yes             Comment: occ         ?  Drug use:  Not on file           Allergies:   No Known Allergies      Current Medications:     No current facility-administered medications on file prior to encounter.          No current outpatient medications on file prior to encounter.          Review of Systems     A complete ROS was reviewed by me today and all other systems negative, unless otherwise specified below:   Review of Systems    Constitutional: Negative.  Negative for chills and fever.    HENT: Negative.  Negative for congestion and sore throat.     Eyes: Negative.     Respiratory: Positive for chest tightness and shortness of breath . Negative for cough and wheezing.     Cardiovascular: Positive for chest pain and palpitations . Negative for leg swelling.    Gastrointestinal: Negative.  Negative for abdominal distention, abdominal pain, blood in stool, constipation, diarrhea, nausea and vomiting.    Endocrine: Negative.     Genitourinary: Negative.  Negative for dysuria, flank pain, frequency, hematuria and urgency.    Musculoskeletal: Negative.  Negative for arthralgias, back pain and myalgias.    Skin: Negative.  Negative  for color change and rash.    Neurological: Positive for dizziness. Negative for syncope, speech difficulty, weakness, light-headedness, numbness and headaches.    Hematological: Negative.     Psychiatric/Behavioral: Negative.  Negative for confusion and self-injury. The patient is not nervous/anxious.     All other systems reviewed and are negative.           Physical Exam     Physical Exam   Vitals and nursing note reviewed.   Constitutional:        General: He is in acute distress.      Appearance: He is well-developed. He is  ill-appearing, toxic-appearing and  diaphoretic.    HENT:       Head: Normocephalic and atraumatic.      Mouth/Throat:      Pharynx: No posterior oropharyngeal erythema.    Eyes:       Conjunctiva/sclera: Conjunctivae normal.   Cardiovascular:       Rate and Rhythm: Normal rate and regular rhythm.      Heart sounds: Normal heart sounds. No murmur heard.   No friction rub. No gallop.    Pulmonary:       Effort: Pulmonary effort is normal. No respiratory distress.      Breath sounds: Normal breath sounds. No wheezing or rales.   Chest:       Chest wall: No tenderness.   Abdominal :      General: Bowel sounds are normal. There is no distension.      Palpations: Abdomen is soft. There is no mass.      Tenderness: There is no abdominal tenderness. There is no guarding or rebound.     Musculoskeletal:          General: Normal range of motion.      Cervical back: Normal  range of motion.    Skin:      General: Skin is warm.   Neurological :       General: No focal deficit present.      Mental Status: He is alert and oriented to person, place, and time.      Motor: No abnormal muscle tone.   Psychiatric:         Behavior: Behavior is cooperative.               Diagnostic Study Results        Labs -    I have personally reviewed and interpreted all available laboratory results.      Recent Results (from the past 24 hour(s))     EKG, 12 LEAD, INITIAL          Collection Time: 06/01/20  7:01 PM          Result  Value  Ref Range            Ventricular Rate  85  BPM       Atrial Rate  85  BPM       P-R Interval  142  ms       QRS Duration  94  ms       Q-T Interval  410  ms       QTC Calculation (Bezet)  487  ms       Calculated P Axis  54  degrees       Calculated R Axis  68  degrees       Calculated T Axis  34  degrees       Diagnosis                 Normal sinus rhythm   Prolonged QT   When compared with ECG of 13-Mar-2017 12:52,   No significant change was found          CBC WITH AUTOMATED DIFF          Collection Time: 06/01/20  7:41 PM         Result  Value  Ref Range            WBC  6.9  4.1 - 11.1 K/uL       RBC  4.43  4.10 - 5.70 M/uL       HGB  13.3  12.1 - 17.0 g/dL       HCT  40.6  36.6 - 50.3 %       MCV  91.6  80.0 - 99.0 FL       MCH  30.0  26.0 - 34.0 PG       MCHC  32.8  30.0 - 36.5 g/dL       RDW  13.4  11.5 - 14.5 %       PLATELET  158  150 - 400 K/uL       MPV  9.4  8.9 - 12.9 FL       NRBC  0.0  0 PER 100 WBC       ABSOLUTE NRBC  0.00  0.00 - 0.01 K/uL       NEUTROPHILS  75  32 - 75 %       LYMPHOCYTES  12  12 - 49 %       MONOCYTES  12  5 - 13 %       EOSINOPHILS  1  0 - 7 %  BASOPHILS  0  0 - 1 %       IMMATURE GRANULOCYTES  0  0.0 - 0.5 %       ABS. NEUTROPHILS  5.1  1.8 - 8.0 K/UL       ABS. LYMPHOCYTES  0.9  0.8 - 3.5 K/UL       ABS. MONOCYTES  0.8  0.0 - 1.0 K/UL       ABS. EOSINOPHILS  0.1  0.0 - 0.4 K/UL       ABS. BASOPHILS  0.0  0.0 - 0.1 K/UL       ABS. IMM. GRANS.  0.0  0.00 - 0.04 K/UL       DF  AUTOMATED          NT-PRO BNP          Collection Time: 06/01/20  7:41 PM         Result  Value  Ref Range            NT pro-BNP  130 (H)  <125 PG/ML       METABOLIC PANEL, COMPREHENSIVE          Collection Time: 06/01/20  7:41 PM         Result  Value  Ref Range            Sodium  137  136 - 145 mmol/L       Potassium  3.5  3.5 - 5.1 mmol/L       Chloride  106  97 - 108 mmol/L       CO2  27  21 - 32 mmol/L       Anion gap  4 (L)  5 - 15 mmol/L       Glucose  91  65 - 100 mg/dL       BUN   6  6 - 20 MG/DL       Creatinine  0.91  0.70 - 1.30 MG/DL       BUN/Creatinine ratio  7 (L)  12 - 20         GFR est AA  >60  >60 ml/min/1.52m       GFR est non-AA  >60  >60 ml/min/1.763m      Calcium  8.7  8.5 - 10.1 MG/DL       Bilirubin, total  1.3 (H)  0.2 - 1.0 MG/DL       ALT (SGPT)  23  12 - 78 U/L       AST (SGOT)  17  15 - 37 U/L       Alk. phosphatase  116  45 - 117 U/L       Protein, total  7.4  6.4 - 8.2 g/dL       Albumin  3.1 (L)  3.5 - 5.0 g/dL       Globulin  4.3 (H)  2.0 - 4.0 g/dL       A-G Ratio  0.7 (L)  1.1 - 2.2         TROPONIN I          Collection Time: 06/01/20  7:41 PM         Result  Value  Ref Range            Troponin-I, Qt.  0.06 (H)  <0.05 ng/mL       URINALYSIS W/ REFLEX CULTURE          Collection Time: 06/01/20  7:54 PM       Specimen: Urine         Result  Value  Ref Range            Color  YELLOW/STRAW          Appearance  CLEAR  CLEAR         Specific gravity  1.006  1.003 - 1.030         pH (UA)  6.0  5.0 - 8.0         Protein  Negative  NEG mg/dL       Glucose  Negative  NEG mg/dL       Ketone  Negative  NEG mg/dL       Bilirubin  Negative  NEG         Blood  Negative  NEG         Urobilinogen  1.0  0.2 - 1.0 EU/dL       Nitrites  Negative  NEG         Leukocyte Esterase  Negative  NEG         WBC  0-4  0 - 4 /hpf       RBC  0-5  0 - 5 /hpf       Epithelial cells  FEW  FEW /lpf       Bacteria  Negative  NEG /hpf       UA:UC IF INDICATED  CULTURE NOT INDICATED BY UA RESULT  CNI         COVID-19 RAPID TEST          Collection Time: 06/01/20  8:46 PM         Result  Value  Ref Range            Specimen source  Nasopharyngeal          COVID-19 rapid test  Not detected  NOTD         TROPONIN I          Collection Time: 06/01/20  9:28 PM         Result  Value  Ref Range            Troponin-I, Qt.  0.13 (H)  <0.05 ng/mL       TROPONIN I          Collection Time: 06/01/20 10:44 PM         Result  Value  Ref Range            Troponin-I, Qt.  0.12 (H)  <0.05 ng/mL       PTT           Collection Time: 06/01/20 10:44 PM         Result  Value  Ref Range            aPTT  26.1  22.1 - 31.0 sec       aPTT, therapeutic range       58.0 - 77.0 SECS       CBC WITH AUTOMATED DIFF          Collection Time: 06/01/20 10:44 PM         Result  Value  Ref Range            WBC  6.3  4.1 - 11.1 K/uL       RBC  4.58  4.10 - 5.70 M/uL       HGB  13.8  12.1 - 17.0 g/dL       HCT  41.5  36.6 - 50.3 %       MCV  90.6  80.0 - 99.0 FL       MCH  30.1  26.0 - 34.0 PG       MCHC  33.3  30.0 - 36.5 g/dL       RDW  13.3  11.5 - 14.5 %       PLATELET  174  150 - 400 K/uL       MPV  9.3  8.9 - 12.9 FL       NRBC  0.0  0 PER 100 WBC       ABSOLUTE NRBC  0.00  0.00 - 0.01 K/uL       NEUTROPHILS  63  32 - 75 %       LYMPHOCYTES  22  12 - 49 %       MONOCYTES  13  5 - 13 %       EOSINOPHILS  1  0 - 7 %       BASOPHILS  1  0 - 1 %       IMMATURE GRANULOCYTES  0  0.0 - 0.5 %       ABS. NEUTROPHILS  4.0  1.8 - 8.0 K/UL       ABS. LYMPHOCYTES  1.4  0.8 - 3.5 K/UL       ABS. MONOCYTES  0.8  0.0 - 1.0 K/UL       ABS. EOSINOPHILS  0.1  0.0 - 0.4 K/UL       ABS. BASOPHILS  0.0  0.0 - 0.1 K/UL       ABS. IMM. GRANS.  0.0  0.00 - 0.04 K/UL            DF  AUTOMATED              Radiologic Studies -    I have personally reviewed and interpreted all available imaging studies and agree with radiology interpretation and report.      XR CHEST PORT       Final Result     No acute cardiopulmonary process seen                 CT Results   (Last 48 hours)          None                 CXR Results   (Last 48 hours)                                    06/01/20 1925    XR CHEST PORT  Final result            Impression:    No acute cardiopulmonary process seen                       Narrative:    EXAM: XR CHEST PORT             INDICATION: Chest pain             COMPARISON: 03/13/2017             FINDINGS: A portable AP radiograph of the chest was obtained at 1922 hours.  The      lungs are clear. The cardiac  and mediastinal contours and pulmonary  vascularity      are remarkable for stable right hilar prominence.  The bones and soft tissues      are grossly within normal limits.                                     Medical Decision Making     I reviewed the vital signs, available nursing notes, past medical history, past surgical history, family history and social history.      Vital Signs-Reviewed the patient's vital signs.   Patient Vitals for the past 24 hrs:            Temp  Pulse  Resp  BP  SpO2            06/01/20 2325  --  78  18  --  100 %            06/01/20 1939  98.9 ??F (37.2 ??C)  80  20  129/89  99 %            06/01/20 1843  98.7 ??F (37.1 ??C)  87  20  (!) 140/91  100 %        Pulse Oximetry Analysis: 100% on RA      Cardiac Monitor:    Rate: 87 bpm   The cardiac monitor revealed the following rhythm as interpreted by me: Normal Sinus Rhythm   Cardiac monitoring was ordered to monitor patient for signs of cardiac dysrhythmia, which they are at risk for based on their history and/or  risk for cardiovascular disease and/or metabolic abnormalities.       Records Reviewed: Nursing Notes, Old Medical Records, Previous electrocardiograms, Previous Radiology Studies and Previous Laboratory Studies      Provider Notes (Medical Decision Making):    DDx includes STEMI, NSTEMI, Angina, PE, Aortic Pathology, Chest Wall Pain, Pleurisy, Pneumonia, GERD/esophagitis, Anxiety.  No cough/fever or focal lung findings to suggest pneumonia.   No tachycardia, hypoxia or pleuritic component to suggest PE.   Pulses symmetric and no extremely elevated BP/asymmetry or classic tearing sensation to suggest Aortic Dissection. Also, no neuro findings.  No wretching/forceful vomiting  to suggest esophageal disaster.  Denies IV drug abuse, has native valves, no fevers/murmurs or skin lesions to suggest endocarditis. Will evaluate with EKG, labs, cardiac enzymes, chest x-ray.  Will provide pain control and reassess.         ED Course:    I am the first physician for this patient's  ED visit today.  Initial assessment performed. I discussed presenting problems, concerns and my formulated plan for today's visit with the patient and any available family members at bedside. I encouraged them  to ask questions as they arise throughout the visit.         Social History          Tobacco Use         ?  Smoking status:  Never Smoker     ?  Smokeless tobacco:  Never Used       Substance Use Topics         ?  Alcohol use:  Yes             Comment: occ         ?  Drug use:  Not on file           I reviewed  our electronic medical record system for any past medical records that were available that may contribute to the patient's current condition, the nursing notes and vital signs from  today's visit.        ED Orders Placed :     Orders Placed This Encounter        ?  MECHANICAL PROPHYLAXIS IS CONTRAINDICATED Other, please document Already on Anticoagulation     ?  COVID-19 RAPID TEST     ?  XR CHEST PORT     ?  CBC WITH AUTOMATED DIFF     ?  URINALYSIS W/ REFLEX CULTURE     ?  NT-PRO BNP     ?  METABOLIC PANEL, COMPREHENSIVE     ?  TROPONIN I     ?  TROPONIN I     ?  METABOLIC PANEL, COMPREHENSIVE     ?  CBC WITH AUTOMATED DIFF     ?  LIPID PANEL     ?  HEMOGLOBIN A1C WITH EAG     ?  TROPONIN I     ?  CBC W/ AUTOMATED DIFF     ?  PTT - BASELINE PRIOR TO INITIATION OF HEPARIN INFUSION     ?  PTT--EVERY 6 HOURS     ?  CBC W/ AUTOMATED DIFF--DAILY WHILE ON HEPARIN     ?  DIET NPO     ?  NOTIFY PROVIDER: SPECIFY Notify provider on pt's arrival to floor ONE TIME STAT     ?  INTAKE AND OUTPUT     ?  VITAL SIGNS PER UNIT ROUTINE     ?  NOTIFY PROVIDER: SPECIFY Notify provider on pt's arrival to floor ONE TIME STAT     ?  INTAKE AND OUTPUT     ?  VITAL SIGNS PER UNIT ROUTINE     ?  VITAL SIGNS     ?  ACTIVITY AS TOLERATED W/ASSIST     ?  CARDIAC MONITORING     ?  NOTIFY PROVIDER: LAB VALUES CHANGES     ?  NOTIFY PROVIDER: SPECIFY If any sign of bleeding and/or hematoma, STOP heparin.  Notify physician STAT and do  STAT CBC.  Hold heparin until notified  by physician.  ONE TIME Routine     ?  NOTIFY PROVIDER: LAB VALUES CHANGES     ?  FULL CODE     ?  EKG, 12 LEAD, INITIAL     ?  DISCONTD: acetaminophen (TYLENOL) tablet 650 mg     ?  DISCONTD: ondansetron (ZOFRAN) injection 4 mg     ?  sodium chloride (NS) flush 5-40 mL     ?  sodium chloride (NS) flush 5-40 mL     ?  OR Linked Order Group           ?  acetaminophen (TYLENOL) tablet 650 mg       ?  acetaminophen (TYLENOL) suppository 650 mg        ?  polyethylene glycol (MIRALAX) packet 17 g     ?  OR Linked Order Group           ?  promethazine (PHENERGAN) tablet 12.5 mg       ?  ondansetron (ZOFRAN) injection 4 mg        ?  DISCONTD: enoxaparin (LOVENOX) injection 100 mg     ?  aspirin chewable tablet 81 mg     ?  atorvastatin (LIPITOR)  tablet 40 mg     ?  nitroglycerin (NITROSTAT) tablet 0.4 mg     ?  heparin 25,000 units in D5W 250 ml infusion     ?  OR Linked Order Group           ?  heparin (porcine) injection 2,000 Units       ?  heparin (porcine) injection 4,000 Units        ?  COVID-19 vac,Ad26-PF (JANSSEN) injection 1 Dose     ?  IP CONSULT TO HOSPITALIST     ?  IP CONSULT TO CARDIOLOGY        ?  INITIAL PHYSICIAN ORDER: INPATIENT Telemetry; Yes; 3. Patient receiving treatment that can only be provided in an inpatient setting (further clarification  in H&P documentation)           ED Medications Administered:     Medications       sodium chloride (NS) flush 5-40 mL (0 mL IntraVENous Held 06/01/20 2226)     sodium chloride (NS) flush 5-40 mL (has no administration in time range)     acetaminophen (TYLENOL) tablet 650 mg (has no administration in time range)       Or     acetaminophen (TYLENOL) suppository 650 mg (has no administration in time range)     polyethylene glycol (MIRALAX) packet 17 g (has no administration in time range)     promethazine (PHENERGAN) tablet 12.5 mg (has no administration in time range)       Or     ondansetron (ZOFRAN) injection 4 mg (has no  administration in time range)     aspirin chewable tablet 81 mg (has no administration in time range)     atorvastatin (LIPITOR) tablet 40 mg (40 mg Oral Given 06/01/20 2306)     nitroglycerin (NITROSTAT) tablet 0.4 mg (has no administration in time range)     heparin 25,000 units in D5W 250 ml infusion (10 Units/kg/hr ?? 99.8 kg IntraVENous New Bag 06/01/20 2307)     heparin (porcine) injection 2,000 Units (has no administration in time range)       Or     heparin (porcine) injection 4,000 Units (has no administration in time range)       COVID-19 vac,Ad26-PF (JANSSEN) injection 1 Dose (has no administration in time range)            Consult Note:   Fredderick Erb, MD spoke with Dr. Donnella Sham   Specialty: Hospitalist   Discussed pt's hx, disposition, and available diagnostic and imaging results. Reviewed care plans. Agree with management and plan thus far. Consultant will evaluate pt.      CRITICAL CARE NOTE :   IMPENDING DETERIORATION -Airway, Respiratory, Cardiovascular, Metabolic and Renal   ASSOCIATED RISK FACTORS - Hypotension, Shock, Hypoxia, Bleeding, Dysrhythmia, Metabolic changes, Dehydration and Vascular Compromise   MANAGEMENT- Bedside Assessment and Supervision of Care   INTERPRETATION -  Xrays, CT Scan, ECG, Blood Pressure and Cardiac Output Measures    INTERVENTIONS - hemodynamic mngmt, vascular control and Neurologic interventions    CASE REVIEW - Hospitalist/Intensivist, Nursing and Family   TREATMENT RESPONSE -Improved and Stable   PERFORMED BY - Self      NOTES   :   I personally spent 80 minutes of critical care time with this patient. This is time spent at this critically ill patient's bedside actively involved in patient care as well as the coordination of care  and discussions with the patient's  family. This includes time involved in lab review, consultations with specialist, family decision-making, bedside attention and documentation. During this entire length of time I was immediately available to  the patient.  This does not include time spent on separately reported billable procedures.       Critical Care: The reason for providing this level of medical care for this critically-ill patient was due to a critical illness that impaired one or more vital organ systems, such that there was  a high probability of imminent or life-threatening deterioration in the patient's condition. This care involved the highest level of preparedness to intervene urgently. This care involved high complexity decision making to assess, manipulate, and support  vital system functions, to treat this degree of vital organ system failure, and to prevent further life threatening deterioration of the patients condition requiring frequent assessments and interventions.      Fredderick Erb, MD      Disposition:    ADMIT   Given the patient's current clinical presentation, I have a high level of concern for decompensation if discharged from the emergency department.       Patient is being admitted to the hospital.  The results of their tests and reasons for their admission have been discussed with them and/or available family.  They convey agreement and understanding for the need to be admitted and for their admission  diagnosis.  Consultation has been made with the inpatient physician specialist for hospitalization.        Diagnosis     Clinical Impression:      1.  Acute respiratory failure with hypoxia ([place])      2.  Acute chest pain      3.  Elevated troponin      4.  Acute dyspnea      5.  Exertional dyspnea      6.  Heart palpitations      7.  Acute pulmonary embolism, unspecified pulmonary embolism type, unspecified whether acute cor pulmonale present ([place])         8.  NSTEMI (non-ST elevated myocardial infarction) ([place])            Attestation:   IFredderick Erb, MD, am the attending of record for this patient. I personally performed the services described in this documentation on this date, 06/01/2020 for patient, Camarion Weier. I have   reviewed the chart and verified that the record is accurate and complete.

## 2020-06-01 NOTE — ED Notes (Signed)
Report given to Abigail, RN. They were informed of patient chief complaint, current status, orders completed (to include IV access/medications/radiology testing), outstanding orders that still need to be completed, and the treatment plan. Ensured no questions or concerns regarding the patient prior to departure.

## 2020-06-01 NOTE — ED Notes (Signed)
Walked COVID swab specimens down to the lab at this time.

## 2020-06-01 NOTE — ED Notes (Signed)
Walked urine specimens down to the lab at this time.

## 2020-06-01 NOTE — H&P (Signed)
H&P by Christell Faith,  MD at 06/01/20 2225                Author: Christell Faith, MD  Service: Internal Medicine  Author Type: Physician       Filed: 06/01/20 2232  Date of Service: 06/01/20 2225  Status: Signed          Editor: Christell Faith, MD (Physician)                                  Hospitalist Admission Note      NAME: Ronald Kennedy    DOB:  26-Mar-1961    MRN:  [id number]       Date/Time:   06/01/2020 10:25 PM      Patient PCP: Other, Phys, MD   ______________________________________________________________________   Given the patient's current clinical presentation, I have a high level of concern for decompensation if discharged from the emergency department.  Complex decision making was performed, which includes reviewing the patient's available past medical records,  laboratory results, and x-ray films.         My assessment of this patient's clinical condition and my plan of care is as follows.      Assessment / Plan:        NSTEMI, POA   Anginal chest pain, POA   Exertional dyspnea, POA   Tobacco smoking, POA   -Presented with anginal chest pain in nature and exertional dyspnea.  Reported diaphoresis while experiencing chest pain   -EKG with no acute ischemic changes   -Initial troponin 0 0.06, full troponin every 6 hours   -Chest pain-free   -TIMI score of 2 (positive cardiac enzymes, 2 episodes of chest pain in the last 24 hours)   -Active smoker is a risk factor for coronary artery disease.  Counseling was provided about tobacco cessation   -Start aspirin and Lipitor   -Start heparin drip   -Consult cardiology for ischemic work-up   -Obtain echo   -Nitroglycerin as needed for pain   -Admit to telemetry         Code Status: Full code         DVT Prophylaxis: Lovenox   GI Prophylaxis: not indicated      Baseline: Active, independent           Subjective:     CHIEF COMPLAINT: Chest pain      HISTORY OF PRESENT ILLNESS:         The patient is 59 years old man with no significant past medical  history presented emergency department due to episodes of chest pain and exertional dyspnea.  He reported that he had 2 episodes of chest pain in the last 24 hours with his chest pain is  sharp, substernal, nonradiating, substernal, aggravated by physical activity and relieved by rest.  He also reported that he gets short of breath every time he moves around when he gets some rest his shortness of breath goes away.  Patient smoke cigar,  does not drink alcohol or use also drugs.         Emergency department, EKG with no acute ischemic changes however his troponin was 0.06.  He was chest pain-free.         We were asked to admit for work up and evaluation of the above problems.       No past medical history on file.  Past Surgical History:         Procedure  Laterality  Date          ?  HX ORTHOPAEDIC              left knee for torn ligament             Social History          Tobacco Use         ?  Smoking status:  Never Smoker     ?  Smokeless tobacco:  Never Used       Substance Use Topics         ?  Alcohol use:  Yes             Comment: occ            No family history on file.   No Known Allergies         Prior to Admission medications        Not on File           REVIEW OF SYSTEMS:      I am not able to complete the review of systems because:        The patient is intubated and sedated       The patient has altered mental status due to his acute medical problems       The patient has baseline aphasia from prior stroke(s)       The patient has baseline dementia and is not reliable historian       The patient is in acute medical distress and unable to provide information                  Total of 12 systems reviewed as follows:        POSITIVE= underlined text  Negative = text not underlined   General:  fever, chills, sweats, generalized weakness, weight loss/gain,       loss of appetite    Eyes:    blurred vision, eye pain, loss of vision, double vision   ENT:    rhinorrhea, pharyngitis    Respiratory:    cough, sputum production, SOB, DOE, wheezing, pleuritic pain    Cardiology:   chest pain,  palpitations, orthopnea, PND, edema, syncope    Gastrointestinal:  abdominal pain , N/V, diarrhea, dysphagia, constipation, bleeding    Genitourinary:  frequency, urgency, dysuria, hematuria, incontinence    Muskuloskeletal :  arthralgia, myalgia, back pain   Hematology:  easy bruising, nose or gum bleeding, lymphadenopathy    Dermatological: rash, ulceration, pruritis, color change / jaundice   Endocrine:   hot flashes or polydipsia    Neurological:  headache, dizziness, confusion, focal weakness, paresthesia,      Speech difficulties, memory loss, gait difficulty   Psychological: Feelings of anxiety, depression, agitation        Objective:     VITALS:     Visit Vitals      BP  129/89 (BP 1 Location: Left upper arm, BP Patient Position: At rest;Sitting)     Pulse  80     Temp  98.9 ??F (37.2 ??C)     Resp  20     Ht  6' 2" (1.88 m)     Wt  99.8 kg (220 lb)     SpO2  99%        BMI  28.25 kg/m??  PHYSICAL EXAM:      General:    Alert, cooperative, no distress, appears stated age.      HEENT: Atraumatic, anicteric sclerae, pink conjunctivae      No oral ulcers, mucosa moist, throat clear, dentition fair   Neck:  Supple, symmetrical,  thyroid: non tender   Lungs:   Clear to auscultation bilaterally.  No Wheezing or Rhonchi. No rales.   Chest wall:  No tenderness  No Accessory muscle use.   Heart:   Regular  rhythm,  No  murmur   No edema   Abdomen:   Soft, non-tender. Not distended.  Bowel sounds normal   Extremities: No cyanosis.  No clubbing,       Skin turgor normal, Capillary refill normal, Radial dial pulse 2+   Skin:     Not pale.  Not Jaundiced  No rashes    Psych:  Good insight.  Not depressed.  Not anxious or agitated.   Neurologic: EOMs intact. No facial asymmetry. No aphasia or slurred speech. Symmetrical strength, Sensation grossly intact. Alert and oriented X 4.        _______________________________________________________________________   Care Plan discussed with:           Comments         Patient  x           Family              RN  x       Care Manager                            Consultant:          _______________________________________________________________________   Expected  Disposition :       Home with Family  x        HH/PT/OT/RN       SNF/LTC       [facility]       ________________________________________________________________________   TOTAL TIME:  35 Minutes      Critical Care Provided      Minutes non procedure based              Comments           x  Reviewed previous records         >50% of visit spent in counseling and coordination of care  x  Discussion with patient and/or family and questions answered           ________________________________________________________________________   Signed: Christell Faith, MD      Procedures: see electronic medical records for all procedures/Xrays and details which were not copied into this note but were reviewed prior to creation of Plan.      LAB DATA REVIEWED:       Recent Results (from the past 24 hour(s))     EKG, 12 LEAD, INITIAL          Collection Time: 06/01/20  7:01 PM         Result  Value  Ref Range            Ventricular Rate  85  BPM       Atrial Rate  85  BPM       P-R Interval  142  ms       QRS Duration  94  ms       Q-T Interval  410  ms  QTC Calculation (Bezet)  487  ms       Calculated P Axis  54  degrees       Calculated R Axis  68  degrees       Calculated T Axis  34  degrees       Diagnosis                 Normal sinus rhythm   Prolonged QT   When compared with ECG of 13-Mar-2017 12:52,   No significant change was found          CBC WITH AUTOMATED DIFF          Collection Time: 06/01/20  7:41 PM         Result  Value  Ref Range            WBC  6.9  4.1 - 11.1 K/uL       RBC  4.43  4.10 - 5.70 M/uL       HGB  13.3  12.1 - 17.0 g/dL       HCT  40.6  36.6 - 50.3 %       MCV  91.6  80.0 - 99.0 FL        MCH  30.0  26.0 - 34.0 PG       MCHC  32.8  30.0 - 36.5 g/dL       RDW  13.4  11.5 - 14.5 %       PLATELET  158  150 - 400 K/uL       MPV  9.4  8.9 - 12.9 FL       NRBC  0.0  0 PER 100 WBC       ABSOLUTE NRBC  0.00  0.00 - 0.01 K/uL       NEUTROPHILS  75  32 - 75 %       LYMPHOCYTES  12  12 - 49 %       MONOCYTES  12  5 - 13 %       EOSINOPHILS  1  0 - 7 %       BASOPHILS  0  0 - 1 %       IMMATURE GRANULOCYTES  0  0.0 - 0.5 %       ABS. NEUTROPHILS  5.1  1.8 - 8.0 K/UL       ABS. LYMPHOCYTES  0.9  0.8 - 3.5 K/UL       ABS. MONOCYTES  0.8  0.0 - 1.0 K/UL       ABS. EOSINOPHILS  0.1  0.0 - 0.4 K/UL       ABS. BASOPHILS  0.0  0.0 - 0.1 K/UL       ABS. IMM. GRANS.  0.0  0.00 - 0.04 K/UL       DF  AUTOMATED          NT-PRO BNP          Collection Time: 06/01/20  7:41 PM         Result  Value  Ref Range            NT pro-BNP  130 (H)  <125 PG/ML       METABOLIC PANEL, COMPREHENSIVE          Collection Time: 06/01/20  7:41 PM         Result  Value  Ref Range  Sodium  137  136 - 145 mmol/L       Potassium  3.5  3.5 - 5.1 mmol/L       Chloride  106  97 - 108 mmol/L       CO2  27  21 - 32 mmol/L       Anion gap  4 (L)  5 - 15 mmol/L       Glucose  91  65 - 100 mg/dL       BUN  6  6 - 20 MG/DL       Creatinine  0.91  0.70 - 1.30 MG/DL       BUN/Creatinine ratio  7 (L)  12 - 20         GFR est AA  >60  >60 ml/min/1.40m       GFR est non-AA  >60  >60 ml/min/1.763m      Calcium  8.7  8.5 - 10.1 MG/DL       Bilirubin, total  1.3 (H)  0.2 - 1.0 MG/DL       ALT (SGPT)  23  12 - 78 U/L       AST (SGOT)  17  15 - 37 U/L       Alk. phosphatase  116  45 - 117 U/L       Protein, total  7.4  6.4 - 8.2 g/dL       Albumin  3.1 (L)  3.5 - 5.0 g/dL       Globulin  4.3 (H)  2.0 - 4.0 g/dL       A-G Ratio  0.7 (L)  1.1 - 2.2         TROPONIN I          Collection Time: 06/01/20  7:41 PM         Result  Value  Ref Range            Troponin-I, Qt.  0.06 (H)  <0.05 ng/mL       URINALYSIS W/ REFLEX CULTURE          Collection Time:  06/01/20  7:54 PM       Specimen: Urine         Result  Value  Ref Range            Color  YELLOW/STRAW          Appearance  CLEAR  CLEAR         Specific gravity  1.006  1.003 - 1.030         pH (UA)  6.0  5.0 - 8.0         Protein  Negative  NEG mg/dL       Glucose  Negative  NEG mg/dL       Ketone  Negative  NEG mg/dL       Bilirubin  Negative  NEG         Blood  Negative  NEG         Urobilinogen  1.0  0.2 - 1.0 EU/dL       Nitrites  Negative  NEG         Leukocyte Esterase  Negative  NEG         WBC  0-4  0 - 4 /hpf       RBC  0-5  0 - 5 /hpf       Epithelial cells  FEW  FEW /lpf  Bacteria  Negative  NEG /hpf       UA:UC IF INDICATED  CULTURE NOT INDICATED BY UA RESULT  CNI         COVID-19 RAPID TEST          Collection Time: 06/01/20  8:46 PM         Result  Value  Ref Range            Specimen source  Nasopharyngeal          COVID-19 rapid test  Not detected  NOTD         TROPONIN I          Collection Time: 06/01/20  9:28 PM         Result  Value  Ref Range            Troponin-I, Qt.  0.13 (H)  <0.05 ng/mL

## 2020-06-02 ENCOUNTER — Inpatient Hospital Stay: Admit: 2020-06-02 | Payer: MEDICARE | Primary: Family Medicine

## 2020-06-02 ENCOUNTER — Inpatient Hospital Stay: Admit: 2020-06-02 | Payer: MEDICARE | Attending: Nurse Practitioner | Primary: Family Medicine

## 2020-06-02 LAB — COMPREHENSIVE METABOLIC PANEL
ALT: 20 U/L (ref 12–78)
ALT: 23 U/L (ref 12–78)
AST: 16 U/L (ref 15–37)
AST: 17 U/L (ref 15–37)
Albumin/Globulin Ratio: 0.7 — ABNORMAL LOW (ref 1.1–2.2)
Albumin/Globulin Ratio: 0.7 — ABNORMAL LOW (ref 1.1–2.2)
Albumin: 3 g/dL — ABNORMAL LOW (ref 3.5–5.0)
Albumin: 3.1 g/dL — ABNORMAL LOW (ref 3.5–5.0)
Alkaline Phosphatase: 114 U/L (ref 45–117)
Alkaline Phosphatase: 116 U/L (ref 45–117)
Anion Gap: 4 mmol/L — ABNORMAL LOW (ref 5–15)
Anion Gap: 4 mmol/L — ABNORMAL LOW (ref 5–15)
BUN: 6 MG/DL (ref 6–20)
BUN: 6 MG/DL (ref 6–20)
Bun/Cre Ratio: 7 — ABNORMAL LOW (ref 12–20)
Bun/Cre Ratio: 7 — ABNORMAL LOW (ref 12–20)
CO2: 27 mmol/L (ref 21–32)
CO2: 28 mmol/L (ref 21–32)
Calcium: 8.3 MG/DL — ABNORMAL LOW (ref 8.5–10.1)
Calcium: 8.7 MG/DL (ref 8.5–10.1)
Chloride: 106 mmol/L (ref 97–108)
Chloride: 107 mmol/L (ref 97–108)
Creatinine: 0.86 MG/DL (ref 0.70–1.30)
Creatinine: 0.91 MG/DL (ref 0.70–1.30)
EGFR IF NonAfrican American: >60 mL/min/{1.73_m2} (ref >60)
EGFR IF NonAfrican American: >60 mL/min/{1.73_m2} (ref >60)
GFR African American: >60 mL/min/{1.73_m2} (ref >60)
GFR African American: >60 mL/min/{1.73_m2} (ref >60)
Globulin: 4.1 g/dL — ABNORMAL HIGH (ref 2.0–4.0)
Globulin: 4.3 g/dL — ABNORMAL HIGH (ref 2.0–4.0)
Glucose: 91 mg/dL (ref 65–100)
Glucose: 98 mg/dL (ref 65–100)
Potassium: 3.5 mmol/L (ref 3.5–5.1)
Potassium: 3.6 mmol/L (ref 3.5–5.1)
Sodium: 137 mmol/L (ref 136–145)
Sodium: 139 mmol/L (ref 136–145)
Total Bilirubin: 1 MG/DL (ref 0.2–1.0)
Total Bilirubin: 1.3 MG/DL — ABNORMAL HIGH (ref 0.2–1.0)
Total Protein: 7.1 g/dL (ref 6.4–8.2)
Total Protein: 7.4 g/dL (ref 6.4–8.2)

## 2020-06-02 LAB — TRANSTHORACIC ECHOCARDIOGRAM (TTE) COMPLETE (CONTRAST/BUBBLE/3D PRN)
AV Area by Peak Velocity: 2.16 cm2
AV Area by VTI: 2.45 cm2
AV Mean Gradient: 2.51 mmHg
AV Peak Gradient: 5.23 mmHg
AV Peak Velocity: 114.36 cm/s
AV VTI: 19.1 cm
AVA/BSA Peak Velocity: 1 cm2/m2
AVA/BSA VTI: 1.1 cm2/m2
Aortic Root: 3.05 cm
Ascending Aorta: 3.18 cm
E/E' Lateral: 6.32
E/E' Ratio (Averaged): 7.22
E/E' Septal: 8.13
IVSd: 1.02 cm — AB (ref 0.6–1)
LA Major Axis: 2.88 cm
LA Minor Axis: 1.27 cm
LV E' Lateral Velocity: 6.96 cm/s
LV E' Septal Velocity: 5.41 cm/s
LV EDV A4C: 104.67 mL
LV EDV Index A4C: 46.3 mL/m2
LV ESV A4C: 56.45 mL
LV ESV Index A4C: 25 mL/m2
LV Ejection Fraction A4C: 46 percent
LV Mass 2D Index: 69.5 g/m2 (ref 49–115)
LV Mass 2D: 157 g (ref 88–224)
LVIDd: 4.36 cm (ref 4.2–5.9)
LVIDs: 3.69 cm
LVOT Diameter: 2.04 cm
LVOT Mean Gradient: 1.14 mmHg
LVOT Peak Gradient: 2.3 mmHg
LVOT Peak Velocity: 75.85 cm/s
LVOT SV: 46.8 mL
LVOT VTI: 14.35 cm
LVPWd: 1.09 cm — AB (ref 0.6–1)
Left Ventricular Ejection Fraction: 53
MV A Velocity: 51.96 cm/s
MV Area by PHT: 2.11 cm2
MV Area by VTI: 2.58 cm2
MV E Velocity: 43.98 cm/s
MV E Wave Deceleration Time: 260.64 ms
MV E/A: 0.85
MV Max Velocity: 70.79 cm/s
MV Mean Gradient: 0.62 mmHg
MV PHT: 104.48 ms
MV Peak Gradient: 2 mmHg
MV VTI: 18.12 cm
PV Max Velocity: 69.62 cm/s
PV Systolic Peak Instantaneous Gradient: 1.94 mmHg

## 2020-06-02 LAB — NM STRESS TEST WITH MYOCARDIAL PERFUSION
Baseline Diastolic BP: 96 mmHg
Baseline HR: 71 {beats}/min
Baseline O2 Sat: 97 %
Baseline Systolic BP: 139 mmHg
Left Ventricular Ejection Fraction: 47
Stress Diastolic BP: 78 mmHg
Stress Estimated Workload: 1 METS
Stress O2 Sat: 100 %
Stress Peak HR: 148 {beats}/min
Stress Percent HR Achieved: 93 %
Stress Rate Pressure Product: 27676 BPM*mmHg
Stress Systolic BP: 187 mmHg
Stress Target HR: 159 {beats}/min

## 2020-06-02 LAB — CBC WITH AUTO DIFFERENTIAL
Basophils %: 1 % (ref 0–1)
Basophils %: 0 % (ref 0–1)
Basophils Absolute: 0 10*3/uL (ref 0.0–0.1)
Basophils Absolute: 0 10*3/uL (ref 0.0–0.1)
Eosinophils %: 1 % (ref 0–7)
Eosinophils %: 2 % (ref 0–7)
Eosinophils Absolute: 0.1 10*3/uL (ref 0.0–0.4)
Eosinophils Absolute: 0.1 10*3/uL (ref 0.0–0.4)
Granulocyte Absolute Count: 0 10*3/uL (ref 0.00–0.04)
Granulocyte Absolute Count: 0 10*3/uL (ref 0.00–0.04)
Hematocrit: 41.5 % (ref 36.6–50.3)
Hematocrit: 40.5 % (ref 36.6–50.3)
Hemoglobin: 13.8 g/dL (ref 12.1–17.0)
Hemoglobin: 13.1 g/dL (ref 12.1–17.0)
Immature Granulocytes: 0 % (ref 0.0–0.5)
Immature Granulocytes: 0 % (ref 0.0–0.5)
Lymphocytes %: 22 % (ref 12–49)
Lymphocytes %: 32 % (ref 12–49)
Lymphocytes Absolute: 1.4 10*3/uL (ref 0.8–3.5)
Lymphocytes Absolute: 1.7 10*3/uL (ref 0.8–3.5)
MCH: 30.1 PG (ref 26.0–34.0)
MCH: 29.6 PG (ref 26.0–34.0)
MCHC: 33.3 g/dL (ref 30.0–36.5)
MCHC: 32.3 g/dL (ref 30.0–36.5)
MCV: 90.6 FL (ref 80.0–99.0)
MCV: 91.4 FL (ref 80.0–99.0)
MPV: 9.3 FL (ref 8.9–12.9)
MPV: 9.2 FL (ref 8.9–12.9)
Monocytes %: 13 % (ref 5–13)
Monocytes %: 16 % — ABNORMAL HIGH (ref 5–13)
Monocytes Absolute: 0.8 10*3/uL (ref 0.0–1.0)
Monocytes Absolute: 0.9 10*3/uL (ref 0.0–1.0)
NRBC Absolute: 0 10*3/uL (ref 0.00–0.01)
NRBC Absolute: 0 10*3/uL (ref 0.00–0.01)
Neutrophils %: 63 % (ref 32–75)
Neutrophils %: 50 % (ref 32–75)
Neutrophils Absolute: 4 10*3/uL (ref 1.8–8.0)
Neutrophils Absolute: 2.6 10*3/uL (ref 1.8–8.0)
Nucleated RBCs: 0 PER 100 WBC
Nucleated RBCs: 0 PER 100 WBC
Platelets: 174 10*3/uL (ref 150–400)
Platelets: 144 10*3/uL — ABNORMAL LOW (ref 150–400)
RBC: 4.58 M/uL (ref 4.10–5.70)
RBC: 4.43 M/uL (ref 4.10–5.70)
RDW: 13.3 % (ref 11.5–14.5)
RDW: 13.6 % (ref 11.5–14.5)
WBC: 6.3 10*3/uL (ref 4.1–11.1)
WBC: 5.3 10*3/uL (ref 4.1–11.1)

## 2020-06-02 LAB — COVID-19, RAPID: SARS-CoV-2, Rapid: NOT DETECTED

## 2020-06-02 LAB — EKG 12-LEAD
Atrial Rate: 85 {beats}/min
Diagnosis: NORMAL
P Axis: 54 degrees
P-R Interval: 142 ms
Q-T Interval: 410 ms
QRS Duration: 94 ms
QTc Calculation (Bazett): 487 ms
R Axis: 68 degrees
T Axis: 34 degrees
Ventricular Rate: 85 {beats}/min

## 2020-06-02 LAB — URINALYSIS W/ REFLEX CULTURE
BACTERIA, URINE: NEGATIVE /hpf
Bacteria: NEGATIVE /hpf
Bilirubin, Urine: NEGATIVE
Bilirubin: NEGATIVE
Blood, Urine: NEGATIVE
Blood: NEGATIVE
Glucose, Ur: NEGATIVE mg/dL
Glucose: NEGATIVE mg/dL
Ketone: NEGATIVE mg/dL
Ketones, Urine: NEGATIVE mg/dL
Leukocyte Esterase, Urine: NEGATIVE
Leukocyte Esterase: NEGATIVE
Nitrite, Urine: NEGATIVE
Nitrites: NEGATIVE
Protein, UA: NEGATIVE mg/dL
Protein: NEGATIVE mg/dL
Specific Gravity, UA: 1.006 (ref 1.003–1.030)
Specific gravity: 1.006 (ref 1.003–1.030)
Urobilinogen, UA, POCT: 1 EU/dL (ref 0.2–1.0)
Urobilinogen: 1 EU/dL (ref 0.2–1.0)
pH (UA): 6 (ref 5.0–8.0)
pH, UA: 6 (ref 5.0–8.0)

## 2020-06-02 LAB — TROPONIN
Troponin I: 0.06 ng/mL — ABNORMAL HIGH (ref <0.05)
Troponin I: 0.07 ng/mL — ABNORMAL HIGH (ref <0.05)
Troponin I: 0.12 ng/mL — ABNORMAL HIGH (ref <0.05)
Troponin I: 0.13 ng/mL — ABNORMAL HIGH (ref <0.05)

## 2020-06-02 LAB — LIPID PANEL
CHOL/HDL Ratio: 2.3 (ref 0.0–5.0)
Chol/HDL Ratio: 2.3 (ref 0.0–5.0)
Cholesterol, Total: 153 MG/DL (ref <200)
Cholesterol, total: 153 MG/DL (ref <200)
HDL Cholesterol: 67 MG/DL
HDL: 67 MG/DL
LDL Calculated: 74.8 MG/DL (ref 0–100)
LDL, calculated: 74.8 MG/DL (ref 0–100)
Triglyceride: 56 MG/DL (ref <150)
Triglycerides: 56 MG/DL (ref <150)
VLDL Cholesterol Calculated: 11.2 MG/DL
VLDL, calculated: 11.2 MG/DL

## 2020-06-02 LAB — APTT
aPTT: 26.1 s (ref 22.1–31.0)
aPTT: 34.5 s — ABNORMAL HIGH (ref 22.1–31.0)

## 2020-06-02 LAB — HEMOGLOBIN A1C W/EAG
Hemoglobin A1C: 5.6 % (ref 4.0–5.6)
eAG: 114 mg/dL

## 2020-06-02 LAB — PROBNP, N-TERMINAL: BNP: 130 PG/ML — ABNORMAL HIGH (ref <125)

## 2020-06-02 LAB — PTT
aPTT: 26.1 s (ref 22.1–31.0)
aPTT: 34.5 s — ABNORMAL HIGH (ref 22.1–31.0)

## 2020-06-02 LAB — METABOLIC PANEL, COMPREHENSIVE
A-G Ratio: 0.7 — ABNORMAL LOW (ref 1.1–2.2)
A-G Ratio: 0.7 — ABNORMAL LOW (ref 1.1–2.2)
ALT (SGPT): 20 U/L (ref 12–78)
ALT (SGPT): 23 U/L (ref 12–78)
AST (SGOT): 16 U/L (ref 15–37)
AST (SGOT): 17 U/L (ref 15–37)
Albumin: 3 g/dL — ABNORMAL LOW (ref 3.5–5.0)
Albumin: 3.1 g/dL — ABNORMAL LOW (ref 3.5–5.0)
Alk. phosphatase: 114 U/L (ref 45–117)
Alk. phosphatase: 116 U/L (ref 45–117)
Anion gap: 4 mmol/L — ABNORMAL LOW (ref 5–15)
Anion gap: 4 mmol/L — ABNORMAL LOW (ref 5–15)
BUN/Creatinine ratio: 7 — ABNORMAL LOW (ref 12–20)
BUN/Creatinine ratio: 7 — ABNORMAL LOW (ref 12–20)
BUN: 6 MG/DL (ref 6–20)
BUN: 6 MG/DL (ref 6–20)
Bilirubin, total: 1 MG/DL (ref 0.2–1.0)
Bilirubin, total: 1.3 MG/DL — ABNORMAL HIGH (ref 0.2–1.0)
CO2: 27 mmol/L (ref 21–32)
CO2: 28 mmol/L (ref 21–32)
Calcium: 8.3 MG/DL — ABNORMAL LOW (ref 8.5–10.1)
Calcium: 8.7 MG/DL (ref 8.5–10.1)
Chloride: 106 mmol/L (ref 97–108)
Chloride: 107 mmol/L (ref 97–108)
Creatinine: 0.86 MG/DL (ref 0.70–1.30)
Creatinine: 0.91 MG/DL (ref 0.70–1.30)
GFR est AA: >60 mL/min/{1.73_m2} (ref >60)
GFR est AA: >60 mL/min/{1.73_m2} (ref >60)
GFR est non-AA: >60 mL/min/{1.73_m2} (ref >60)
GFR est non-AA: >60 mL/min/{1.73_m2} (ref >60)
Globulin: 4.1 g/dL — ABNORMAL HIGH (ref 2.0–4.0)
Globulin: 4.3 g/dL — ABNORMAL HIGH (ref 2.0–4.0)
Glucose: 91 mg/dL (ref 65–100)
Glucose: 98 mg/dL (ref 65–100)
Potassium: 3.5 mmol/L (ref 3.5–5.1)
Potassium: 3.6 mmol/L (ref 3.5–5.1)
Protein, total: 7.1 g/dL (ref 6.4–8.2)
Protein, total: 7.4 g/dL (ref 6.4–8.2)
Sodium: 137 mmol/L (ref 136–145)
Sodium: 139 mmol/L (ref 136–145)

## 2020-06-02 LAB — ECHO ADULT COMPLETE
AV Area by Peak Velocity: 2.16 cm2
AV Area by VTI: 2.45 cm2
AV Mean Gradient: 2.51 mmHg
AV Peak Gradient: 5.23 mmHg
AV Peak Velocity: 114.36 cm/s
AV VTI: 19.1 cm
AVA/BSA Peak Velocity: 1 cm2/m2
AVA/BSA VTI: 1.1 cm2/m2
Aortic Root: 3.05 cm
Ascending Aorta: 3.18 cm
E/E' Lateral: 6.32
E/E' Ratio (Averaged): 7.22
E/E' Septal: 8.13
IVSd: 1.02 cm — AB (ref 0.60–1.00)
LA Major Axis: 2.88 cm
LA Minor Axis: 1.27 cm
LV E' Lateral Velocity: 6.96 cm/s
LV E' Septal Velocity: 5.41 cm/s
LV EDV A4C: 104.67 mL
LV EDV Index A4C: 46.3 mL/m2
LV ESV A4C: 56.45 mL
LV ESV Index A4C: 25 mL/m2
LV Ejection Fraction A4C: 46 percent
LV Mass 2D Index: 69.5 g/m2 (ref 49–115)
LV Mass 2D: 157 g (ref 88–224)
LVIDd: 4.36 cm (ref 4.20–5.90)
LVIDs: 3.69 cm
LVOT Diameter: 2.04 cm
LVOT Peak Gradient: 2.3 mmHg
LVOT Peak Velocity: 75.85 cm/s
LVOT SV: 46.8 mL
LVOT VTI: 14.35 cm
LVPWd: 1.09 cm — AB (ref 0.60–1.00)
Left Ventricular Outflow Tract Mean Gradient: 1.14 mmHg
MV A Velocity: 51.96 cm/s
MV Area by PHT: 2.11 cm2
MV Area by VTI: 2.58 cm2
MV E Velocity: 43.98 cm/s
MV E Wave Deceleration Time: 260.64 ms
MV E/A: 0.85
MV Max Velocity: 70.79 cm/s
MV Mean Gradient: 0.62 mmHg
MV PHT: 104.48 ms
MV Peak Gradient: 2 mmHg
MV VTI: 18.12 cm
PV Max Velocity: 69.62 cm/s
PV Systolic Peak Instantaneous Gradient: 1.94 mmHg

## 2020-06-02 LAB — NUCLEAR CARDIAC STRESS TEST
Baseline Diastolic BP: 96 mmHg
Baseline HR: 71 {beats}/min
Baseline O2 Sat: 97 %
Baseline Systolic BP: 139 mmHg
Stress Diastolic BP: 78 mmHg
Stress Estimated Workload: 1 METS
Stress O2 Sat: 100 %
Stress Peak HR: 148 {beats}/min
Stress Percent HR Achieved: 93 %
Stress Rate Pressure Product: 27676 BPM*mmHg
Stress Systolic BP: 187 mmHg
Stress Target HR: 159 {beats}/min

## 2020-06-02 LAB — CBC WITH AUTOMATED DIFF
ABS. BASOPHILS: 0 10*3/uL (ref 0.0–0.1)
ABS. BASOPHILS: 0 10*3/uL (ref 0.0–0.1)
ABS. EOSINOPHILS: 0.1 10*3/uL (ref 0.0–0.4)
ABS. EOSINOPHILS: 0.1 10*3/uL (ref 0.0–0.4)
ABS. IMM. GRANS.: 0 10*3/uL (ref 0.00–0.04)
ABS. IMM. GRANS.: 0 10*3/uL (ref 0.00–0.04)
ABS. LYMPHOCYTES: 1.4 10*3/uL (ref 0.8–3.5)
ABS. LYMPHOCYTES: 1.7 10*3/uL (ref 0.8–3.5)
ABS. MONOCYTES: 0.8 10*3/uL (ref 0.0–1.0)
ABS. MONOCYTES: 0.9 10*3/uL (ref 0.0–1.0)
ABS. NEUTROPHILS: 4 10*3/uL (ref 1.8–8.0)
ABS. NEUTROPHILS: 2.6 10*3/uL (ref 1.8–8.0)
ABSOLUTE NRBC: 0 10*3/uL (ref 0.00–0.01)
ABSOLUTE NRBC: 0 10*3/uL (ref 0.00–0.01)
BASOPHILS: 1 % (ref 0–1)
BASOPHILS: 0 % (ref 0–1)
EOSINOPHILS: 1 % (ref 0–7)
EOSINOPHILS: 2 % (ref 0–7)
HCT: 41.5 % (ref 36.6–50.3)
HCT: 40.5 % (ref 36.6–50.3)
HGB: 13.8 g/dL (ref 12.1–17.0)
HGB: 13.1 g/dL (ref 12.1–17.0)
IMMATURE GRANULOCYTES: 0 % (ref 0.0–0.5)
IMMATURE GRANULOCYTES: 0 % (ref 0.0–0.5)
LYMPHOCYTES: 22 % (ref 12–49)
LYMPHOCYTES: 32 % (ref 12–49)
MCH: 30.1 PG (ref 26.0–34.0)
MCH: 29.6 PG (ref 26.0–34.0)
MCHC: 33.3 g/dL (ref 30.0–36.5)
MCHC: 32.3 g/dL (ref 30.0–36.5)
MCV: 90.6 FL (ref 80.0–99.0)
MCV: 91.4 FL (ref 80.0–99.0)
MONOCYTES: 13 % (ref 5–13)
MONOCYTES: 16 % — ABNORMAL HIGH (ref 5–13)
MPV: 9.3 FL (ref 8.9–12.9)
MPV: 9.2 FL (ref 8.9–12.9)
NEUTROPHILS: 63 % (ref 32–75)
NEUTROPHILS: 50 % (ref 32–75)
NRBC: 0 PER 100 WBC
NRBC: 0 PER 100 WBC
PLATELET: 174 10*3/uL (ref 150–400)
PLATELET: 144 10*3/uL — ABNORMAL LOW (ref 150–400)
RBC: 4.58 M/uL (ref 4.10–5.70)
RBC: 4.43 M/uL (ref 4.10–5.70)
RDW: 13.3 % (ref 11.5–14.5)
RDW: 13.6 % (ref 11.5–14.5)
WBC: 6.3 10*3/uL (ref 4.1–11.1)
WBC: 5.3 10*3/uL (ref 4.1–11.1)

## 2020-06-02 LAB — EKG, 12 LEAD, INITIAL
Atrial Rate: 85 {beats}/min
Calculated P Axis: 54 degrees
Calculated R Axis: 68 degrees
Calculated T Axis: 34 degrees
Diagnosis: NORMAL
P-R Interval: 142 ms
Q-T Interval: 410 ms
QRS Duration: 94 ms
QTC Calculation (Bezet): 487 ms
Ventricular Rate: 85 {beats}/min

## 2020-06-02 LAB — COVID-19 RAPID TEST: COVID-19 rapid test: NOT DETECTED

## 2020-06-02 LAB — TROPONIN I
Troponin-I, Qt.: 0.06 ng/mL — ABNORMAL HIGH (ref <0.05)
Troponin-I, Qt.: 0.07 ng/mL — ABNORMAL HIGH (ref <0.05)
Troponin-I, Qt.: 0.12 ng/mL — ABNORMAL HIGH (ref <0.05)
Troponin-I, Qt.: 0.13 ng/mL — ABNORMAL HIGH (ref <0.05)

## 2020-06-02 LAB — HEMOGLOBIN A1C WITH EAG
Est. average glucose: 114 mg/dL
Hemoglobin A1c: 5.6 % (ref 4.0–5.6)

## 2020-06-02 LAB — NT-PRO BNP: NT pro-BNP: 130 PG/ML — ABNORMAL HIGH (ref <125)

## 2020-06-02 MED ORDER — COVID-19 VACCINE, AD26.COV2.S (JANSSEN)(PF) 0.5 ML IM SUSPENSION (EUA)
0.5 mL | INTRAMUSCULAR | Status: DC
Start: 2020-06-02 — End: 2020-06-05

## 2020-06-02 MED ORDER — HEPARIN (PORCINE) IN D5W 25,000 UNIT/250 ML IV
25000 unit/250 mL(100 unit/mL) | INTRAVENOUS | Status: DC
Start: 2020-06-02 — End: 2020-06-02
  Administered 2020-06-02 (×2): via INTRAVENOUS

## 2020-06-02 MED ORDER — ESMOLOL 10 MG/ML IV SOLN
100 mg/10 mL (10 mg/mL) | INTRAVENOUS | Status: AC
Start: 2020-06-02 — End: ?

## 2020-06-02 MED ORDER — DOBUTAMINE IN D5W 2,000 MCG/ML IV
500 mg/250 mL (2,000 mcg/mL) | INTRAVENOUS | Status: DC
Start: 2020-06-02 — End: 2020-06-02

## 2020-06-02 MED ORDER — HEPARIN (PORCINE) 5,000 UNIT/ML IJ SOLN
5000 unit/mL | INTRAMUSCULAR | Status: DC | PRN
Start: 2020-06-02 — End: 2020-06-02
  Administered 2020-06-02: 10:00:00 via INTRAVENOUS

## 2020-06-02 MED ORDER — SODIUM CHLORIDE 0.9 % IJ SYRG
Freq: Three times a day (TID) | INTRAMUSCULAR | Status: DC
Start: 2020-06-02 — End: 2020-06-05
  Administered 2020-06-02 – 2020-06-05 (×11): via INTRAVENOUS

## 2020-06-02 MED ORDER — ACETAMINOPHEN 325 MG TABLET
325 mg | Freq: Four times a day (QID) | ORAL | Status: DC | PRN
Start: 2020-06-02 — End: 2020-06-05

## 2020-06-02 MED ORDER — SODIUM CHLORIDE 0.9 % IJ SYRG
INTRAMUSCULAR | Status: DC | PRN
Start: 2020-06-02 — End: 2020-06-05

## 2020-06-02 MED ORDER — POLYETHYLENE GLYCOL 3350 17 GRAM (100 %) ORAL POWDER PACKET
17 gram | Freq: Every day | ORAL | Status: DC | PRN
Start: 2020-06-02 — End: 2020-06-05

## 2020-06-02 MED ORDER — NITROGLYCERIN 0.4 MG SUBLINGUAL TAB
0.4 mg | SUBLINGUAL | Status: DC | PRN
Start: 2020-06-02 — End: 2020-06-05

## 2020-06-02 MED ORDER — ATROPINE 0.1 MG/ML SYRINGE
0.1 mg/mL | INTRAMUSCULAR | Status: DC | PRN
Start: 2020-06-02 — End: 2020-06-02
  Administered 2020-06-02 (×2): via INTRAVENOUS

## 2020-06-02 MED ORDER — PROMETHAZINE 25 MG TAB
25 mg | Freq: Four times a day (QID) | ORAL | Status: DC | PRN
Start: 2020-06-02 — End: 2020-06-05

## 2020-06-02 MED ORDER — ENOXAPARIN 40 MG/0.4 ML SUB-Q SYRINGE
40 mg/0.4 mL | Freq: Two times a day (BID) | SUBCUTANEOUS | Status: DC
Start: 2020-06-02 — End: 2020-06-01
  Administered 2020-06-02: 02:00:00 via SUBCUTANEOUS

## 2020-06-02 MED ORDER — ONDANSETRON (PF) 4 MG/2 ML INJECTION
4 mg/2 mL | Freq: Four times a day (QID) | INTRAMUSCULAR | Status: DC | PRN
Start: 2020-06-02 — End: 2020-06-05

## 2020-06-02 MED ORDER — ASPIRIN 81 MG CHEWABLE TAB
81 mg | Freq: Every day | ORAL | Status: DC
Start: 2020-06-02 — End: 2020-06-05
  Administered 2020-06-02 – 2020-06-05 (×4): via ORAL

## 2020-06-02 MED ORDER — ACETAMINOPHEN 325 MG TABLET
325 mg | Freq: Four times a day (QID) | ORAL | Status: DC | PRN
Start: 2020-06-02 — End: 2020-06-01

## 2020-06-02 MED ORDER — METOPROLOL TARTRATE 25 MG TAB
25 mg | Freq: Two times a day (BID) | ORAL | Status: DC
Start: 2020-06-02 — End: 2020-06-05
  Administered 2020-06-03 – 2020-06-04 (×3): via ORAL

## 2020-06-02 MED ORDER — TECHNETIUM TC-99M SESTAMIBI (CARDIOLITE) INJECTION WITH DILUTION KIT
Freq: Once | INTRAVENOUS | Status: AC
Start: 2020-06-02 — End: 2020-06-02
  Administered 2020-06-02: 14:00:00 via INTRAVENOUS

## 2020-06-02 MED ORDER — ATORVASTATIN 40 MG TAB
40 mg | Freq: Every evening | ORAL | Status: DC
Start: 2020-06-02 — End: 2020-06-05
  Administered 2020-06-02 – 2020-06-05 (×4): via ORAL

## 2020-06-02 MED ORDER — ONDANSETRON (PF) 4 MG/2 ML INJECTION
4 mg/2 mL | INTRAMUSCULAR | Status: DC | PRN
Start: 2020-06-02 — End: 2020-06-01

## 2020-06-02 MED ORDER — ATROPINE 0.1 MG/ML SYRINGE
0.1 mg/mL | INTRAMUSCULAR | Status: AC
Start: 2020-06-02 — End: ?

## 2020-06-02 MED ORDER — DOBUTAMINE IN D5W 2,000 MCG/ML IV
500 mg/250 mL (2,000 mcg/mL) | INTRAVENOUS | Status: AC
Start: 2020-06-02 — End: ?

## 2020-06-02 MED ORDER — TECHNETIUM TC-99M SESTAMIBI (CARDIOLITE) INJECTION WITH DILUTION KIT
Freq: Once | INTRAVENOUS | Status: AC
Start: 2020-06-02 — End: 2020-06-02
  Administered 2020-06-02: 17:00:00 via INTRAVENOUS

## 2020-06-02 MED ORDER — DOBUTAMINE IN D5W 2,000 MCG/ML IV
500 mg/250 mL (2,000 mcg/mL) | INTRAVENOUS | Status: DC
Start: 2020-06-02 — End: 2020-06-02
  Administered 2020-06-02 (×4): via INTRAVENOUS

## 2020-06-02 MED ORDER — HEPARIN (PORCINE) 5,000 UNIT/ML IJ SOLN
5000 unit/mL | INTRAMUSCULAR | Status: DC | PRN
Start: 2020-06-02 — End: 2020-06-02

## 2020-06-02 MED ORDER — ACETAMINOPHEN 650 MG RECTAL SUPPOSITORY
650 mg | Freq: Four times a day (QID) | RECTAL | Status: DC | PRN
Start: 2020-06-02 — End: 2020-06-05

## 2020-06-02 MED ORDER — METOPROLOL TARTRATE 5 MG/5 ML IV SOLN
5 mg/ mL | INTRAVENOUS | Status: AC
Start: 2020-06-02 — End: ?

## 2020-06-02 MED FILL — DOBUTAMINE IN D5W 2,000 MCG/ML IV: 500 mg/250 mL (2,000 mcg/mL) | INTRAVENOUS | Qty: 250

## 2020-06-02 MED FILL — ASPIRIN 81 MG CHEWABLE TAB: 81 mg | ORAL | Qty: 1

## 2020-06-02 MED FILL — METOPROLOL TARTRATE 5 MG/5 ML IV SOLN: 5 mg/ mL | INTRAVENOUS | Qty: 5

## 2020-06-02 MED FILL — BD POSIFLUSH NORMAL SALINE 0.9 % INJECTION SYRINGE: INTRAMUSCULAR | Qty: 40

## 2020-06-02 MED FILL — ESMOLOL 10 MG/ML IV SOLN: 100 mg/10 mL (10 mg/mL) | INTRAVENOUS | Qty: 10

## 2020-06-02 MED FILL — ATROPINE 0.1 MG/ML SYRINGE: 0.1 mg/mL | INTRAMUSCULAR | Qty: 10

## 2020-06-02 MED FILL — HEPARIN (PORCINE) IN D5W 25,000 UNIT/250 ML IV: 25000 unit/250 mL(100 unit/mL) | INTRAVENOUS | Qty: 250

## 2020-06-02 MED FILL — HEPARIN (PORCINE) 5,000 UNIT/ML IJ SOLN: 5000 unit/mL | INTRAMUSCULAR | Qty: 1

## 2020-06-02 MED FILL — ATORVASTATIN 40 MG TAB: 40 mg | ORAL | Qty: 1

## 2020-06-02 NOTE — Progress Notes (Signed)
Nuclear Med -  MPI completed @ 1400 today. Please check with DR regarding diet

## 2020-06-02 NOTE — Progress Notes (Signed)
Pt arrived for Nuclear stress test/exercise.  Pt uses a cane.  Pt reports having coffee this am.  NP notified.  Orders changed to Dobutamine stress Nuclear study.  Consent obtained.

## 2020-06-02 NOTE — Progress Notes (Signed)
Mr Majkut was received alert & oriented in assigned room.    Covid EAU and Covid efficacy information provided.    Expressed verbal questions and all were answered.     He remains undecided on vaccine at this time.

## 2020-06-02 NOTE — Progress Notes (Signed)
CM attempted to see pt to complete initial assessment; however, pt currently having a procedure.  CM will follow up.    Buzzy Han, MSW  Care Management, [facility]   [phone]

## 2020-06-02 NOTE — Progress Notes (Signed)
Pt completed Dobutamine stress test.  No distress noted.  Pt transported to Nuclear med for pictures.

## 2020-06-02 NOTE — Progress Notes (Signed)
Problem: Falls - Risk of  Goal: *Absence of Falls  Description: Document Ronald Kennedy Fall Risk and appropriate interventions in the flowsheet.  Outcome: Progressing Towards Goal  Note: Fall Risk Interventions:  Mobility Interventions: Assess mobility with egress test, Patient to call before getting OOB, Utilize walker, cane, or other assistive device                   History of Falls Interventions: Investigate reason for fall

## 2020-06-02 NOTE — Progress Notes (Signed)
 Transition of Care Plan:    RUR: 9%  Disposition: home   Follow up appointments: PCP, Specialist  DME needed: Pt has a cane.  Transportation at Discharge: Medicaid transport  Keys or means to access home:   yes     IM Medicare Letter: needed at d/c  Is patient a BCPI-A Bundle:  n/a         If yes, was Bundle Letter given?:   n/a  Caregiver Contact: Cayman Kielbasa [phone]  Discharge Caregiver contacted prior to discharge?  CM will contact at d/c if pt desires.    Reason for Admission:  NSTEMI                     RUR Score:          9%           Plan for utilizing home health:     As needed     PCP: First and Last name:  Other, Phys, MD  Pt needs a new pt appt.     Name of Practice:    Are you a current patient: Yes/No:    Approximate date of last visit:    Can you participate in a virtual visit with your PCP:                     Current Advanced Directive/Advance Care Plan: Full Code  Advance Care Planning     General Advance Care Planning (ACP) Conversation      Date of Conversation:06/02/20  Conducted with: Patient with Decision Making Capacity    Healthcare Decision Maker:   No healthcare decision makers have been documented.   Click here to complete Clinical research associate of the Environmental health practitioner Relationship (ie Primary)    Content/Action Overview:   DECLINED ACP conversation - will revisit periodically   Reviewed DNR/DNI and patient elects Full Code (Attempt Resuscitation)      Length of Voluntary ACP Conversation in minutes:  <16 minutes (Non-Billable)    Leeroy Foyer                           Transition of Care Plan:    Home    CM met with pt at bedside to introduce self/role, verify demographics, insurance and PCP.  CM also discussed d/c plan.      Pt is a 59 yo, divorced, AA male who was admitted to [facility] on 06/01/20 with a dx of NSTEMI.  Pt needs a new pt appt at d/c.  Pt does not take any prescription medication and therefore stated he does not have a pharmacy.  Pt lives  alone in a 2 fl home with 2 STE.  Pt has a supportive brother.  Pt uses a cane.  Pt does not drive.  Pt can complete his ADLs/IADLs independently at baseline.  Pt has a hx of HH but was unable to recall which agency.  Pt denied a hx of SNF or IPR.  Pt will need Medicaid transport at d/c.     CM will continue to assess for d/c needs.    Care Management Interventions  PCP Verified by CM: Yes (Pt needs a new pt PCP appt at d/c.)  Palliative Care Criteria Met (RRAT>21 & CHF Dx)?: No  Mode of Transport at Discharge: Other (see comment) (Medicaid transport)  Transition of Care Consult (CM Consult): Discharge Planning  Discharge Durable Medical Equipment: No (  Pt uses a cane.)  Physical Therapy Consult: No  Occupational Therapy Consult: No  Speech Therapy Consult: No  Support Systems: Other Family Member(s)  Confirm Follow Up Transport: Other (see comment) (Medicaid transport)  The Patient and/or Patient Representative was Provided with a Choice of Provider and Agrees with the Discharge Plan?: Yes  Name of the Patient Representative Who was Provided with a Choice of Provider and Agrees with the Discharge Plan: Kaedan Richert  Discharge Location  Discharge Placement: Home    Leeroy Foyer, MSW  Care Management, [facility]   [phone]

## 2020-06-02 NOTE — Progress Notes (Signed)
Progress Notes by Minette Brine, MD at 06/02/20 [phone]                Author: Minette Brine, MD  Service: Internal Medicine  Author Type: Physician       Filed: 06/02/20 1821  Date of Service: 06/02/20 0734  Status: Addendum          Editor: Minette Brine, MD (Physician)          Related Notes: Original Note by Minette Brine, MD (Physician) filed at 06/02/20 1820                       Hospitalist Progress Note      NAME: Ronald Kennedy    DOB:  [birth year]    MRN:  [id number]          Assessment / Plan:     NSTEMI, POA ruled out   Syncopal episode   Anginal chest pain, POA   Exertional dyspnea, POA   Tobacco smoking, POA   Hypoxia upon ambulation per RN   -Presented with anginal chest pain in nature and exertional dyspnea.  Reported diaphoresis while experiencing chest pain   -EKG with no acute ischemic changes   -Initial troponin 0 0.06, full troponin every 6 hours   -Chest pain-free   -TIMI score of 2 (positive cardiac enzymes, 2 episodes of chest pain in the last 24 hours)   -Active smoker is a risk factor for coronary artery disease.  Counseling was provided about tobacco cessation   Status post heparin drip, continue with aspirin   Had a negative stress test   Plan for event monitor as outpatient   RN reported that patient oxygen level dropped down he was moving, requiring to be placed on 2 L oxygen via nasal cannula   Check orthostatics, check D-dimer, if positive will do a CTA   Code Status: Full code   ??   ??   DVT Prophylaxis: Lovenox   GI Prophylaxis: not indicated   ??   Baseline: Active, independent         25.0 - 29.9 Overweight  / Body mass index is 28.25 kg/m??.      Estimated discharge date: September 18   Barriers:      Code status: Full   Prophylaxis: Was on heparin drip   Recommended Disposition: Home w/Family          Subjective:        Chief Complaint / Reason for Physician Visit   Follow-up syncope discussed with RN events overnight.    Patient had a stress negative test, but worried  about going home without knowing why he is getting presyncopal episode   Review of Systems:           Symptom  Y/N  Comments    Symptom  Y/N  Comments             Fever/Chills        Chest Pain  n               Poor Appetite        Edema                 Cough        Abdominal Pain  n       Sputum        Joint Pain         SOB/DOE  n  Pruritis/Rash         Nausea/vomit        Tolerating PT/OT         Diarrhea        Tolerating Diet  y               Constipation        Other               Could NOT obtain due to:            Objective:        VITALS:    Last 24hrs VS reviewed since prior progress note. Most recent are:   Patient Vitals for the past 24 hrs:            Temp  Pulse  Resp  BP  SpO2            06/02/20 0434  98.2 ??F (36.8 ??C)  65  18  116/75  98 %            06/01/20 2325  98 ??F (36.7 ??C)  78  18  (!) 155/99  100 %     06/01/20 1939  98.9 ??F (37.2 ??C)  80  20  129/89  99 %            06/01/20 1843  98.7 ??F (37.1 ??C)  87  20  (!) 140/91  100 %        No intake or output data in the 24 hours ending 06/02/20 0735       I had a face to face encounter and independently examined this patient on 06/02/2020, as outlined below:   PHYSICAL EXAM:   General: WD, WN. Alert, cooperative, no acute distress     EENT:  EOMI. Anicteric sclerae. MMM   Resp:  CTA bilaterally, no wheezing or rales.  No accessory muscle use   CV:  Regular  rhythm,  No edema   GI:  Soft, Non distended, Non tender.  +Bowel sounds   Neurologic:  Alert and oriented X 3, normal speech,    Psych:   Good insight. Not anxious nor agitated   Skin:  No rashes.  No jaundice      Reviewed most current lab test results and cultures  YES   Reviewed most current radiology test results   YES   Review and summation of old records today    NO   Reviewed patient's current orders and MAR    YES   PMH/SH reviewed - no change compared to H&P   ________________________________________________________________________   Care Plan discussed with:           Comments          Patient  x           Family              RN  x       Care Manager             Consultant   x  cards                             Multidiciplinary team rounds were held today with case manager, nursing, pharmacist and clinical coordinator.  Patient's plan of care was discussed; medications  were reviewed and discharge planning was addressed.       ________________________________________________________________________   Total NON critical care TIME:35  Minutes      Total CRITICAL CARE TIME Spent:   Minutes non procedure based              Comments         >50% of visit spent in counseling and coordination of care         ________________________________________________________________________   Minette Brine, MD       Procedures: see electronic medical records for all procedures/Xrays and details which were not copied into this note but were reviewed prior to creation of Plan.        LABS:   I reviewed today's most current labs and imaging studies.   Pertinent labs include:     Recent Labs             06/02/20   0433  06/01/20   2244  06/01/20   1941     WBC  5.3  6.3  6.9     HGB  13.1  13.8  13.3     HCT  40.5  41.5  40.6          PLT  144*  174  158          Recent Labs            06/02/20   0433  06/01/20   1941     NA  139  137     K  3.6  3.5     CL  107  106     CO2  28  27     GLU  98  91     BUN  6  6     CREA  0.86  0.91     CA  8.3*  8.7     ALB  3.0*  3.1*     TBILI  1.0  1.3*         ALT  20  23           Signed: Brendi Mccarroll Geoffry Paradise, MD

## 2020-06-02 NOTE — Consults (Signed)
Consults  by Jason Coop, MD at 06/02/20 [id number]                Author: Jason Coop, MD  Service: Cardiology  Author Type: Physician       Filed: 06/02/20 1951  Date of Service: 06/02/20 0943  Status: Addendum          Editor: Jason Coop, MD (Physician)          Related Notes: Original Note by Ilona Sorrel, NP (Nurse Practitioner) filed at 06/02/20  [phone]            Consult Orders        1. IP CONSULT TO CARDIOLOGY [[id number]] ordered by Avel Sensor, MD at 06/01/20 2224                                     CARDIOLOGY INITIAL EVALUATION- [facility]       Date of  Admission: 06/01/2020  7:04 PM       Admission type:Emergency      Referral for: cp with trop   Referral by: hospitalist          Subjective:        Ronald Kennedy is a 59 y.o.  male with no significant PMH who was admitted for NSTEMI (non-ST elevated myocardial infarction) (HCC) [I21.4]   Exertional dyspnea [R06.00].      Per ED provider note Ronald Kennedy presented to the ED with c/o palpitations, dizziness, DOE, and chest pain with activity.   Referred to Cardiology  for  Chest pain with troponin up to 0.13.  On assessment, Ronald Kennedy endorses the above.  He has had the above symptoms for the past few days.   He rested for a few days and went out to the store and the symptoms had returned.  He notices the elevated HR first and then he becomes dizzy, SOB, and has midsternal chest pain.  No radiation of pain.  Patient feels fine when he doesn't notice the elevated  HR.  He denies symptoms at this time resting in bed.  He once noticed elevated HR after drinking, but not every time.           Ronald Kennedy  Does not follow with cardiology.  No prior records in our system. Denies significant family history.           Cardiac risk factors: smoking/ tobacco exposure, obesity, male gender.           Patient Active Problem List           Diagnosis  Date Noted         ?  Exertional dyspnea  06/01/2020          ?  NSTEMI (non-ST elevated myocardial infarction) (HCC)  06/01/2020         Other, Phys, MD   History reviewed. No pertinent past medical history.      Social History          Socioeconomic History         ?  Marital status:  DIVORCED              Spouse name:  Not on file         ?  Number of children:  Not on file     ?  Years of education:  Not on file     ?  Highest education level:  Not on file       Tobacco Use         ?  Smoking status:  Never Smoker     ?  Smokeless tobacco:  Never Used       Substance and Sexual Activity         ?  Alcohol use:  Yes             Comment: occ          Social Determinants of Financial controller Strain:         ?  Difficulty of Paying Living Expenses:        Food Insecurity:         ?  Worried About Programme researcher, broadcasting/film/video in the Last Year:      ?  Barista in the Last Year:        Transportation Needs:         ?  Freight forwarder (Medical):      ?  Lack of Transportation (Non-Medical):        Physical Activity:         ?  Days of Exercise per Week:      ?  Minutes of Exercise per Session:        Stress:         ?  Feeling of Stress :        Social Connections:         ?  Frequency of Communication with Friends and Family:      ?  Frequency of Social Gatherings with Friends and Family:      ?  Attends Religious Services:      ?  Active Member of Clubs or Organizations:      ?  Attends Banker Meetings:         ?  Marital Status:         No Known Allergies    History reviewed. No pertinent family history.      Current Facility-Administered Medications          Medication  Dose  Route  Frequency           ?  sodium chloride (NS) flush 5-40 mL   5-40 mL  IntraVENous  Q8H     ?  sodium chloride (NS) flush 5-40 mL   5-40 mL  IntraVENous  PRN     ?  acetaminophen (TYLENOL) tablet 650 mg   650 mg  Oral  Q6H PRN          Or           ?  acetaminophen (TYLENOL) suppository 650 mg   650 mg  Rectal  Q6H PRN     ?  polyethylene glycol (MIRALAX)  packet 17 g   17 g  Oral  DAILY PRN     ?  promethazine (PHENERGAN) tablet 12.5 mg   12.5 mg  Oral  Q6H PRN          Or           ?  ondansetron (ZOFRAN) injection 4 mg   4 mg  IntraVENous  Q6H PRN     ?  aspirin chewable tablet 81 mg   81 mg  Oral  DAILY     ?  atorvastatin (LIPITOR) tablet 40 mg   40  mg  Oral  QHS     ?  nitroglycerin (NITROSTAT) tablet 0.4 mg   0.4 mg  SubLINGual  PRN     ?  heparin (porcine) injection 2,000 Units   2,000 Units  IntraVENous  PRN          Or           ?  heparin (porcine) injection 4,000 Units   4,000 Units  IntraVENous  PRN           ?  COVID-19 vac,Ad26-PF (JANSSEN) injection 1 Dose   1 Dose  IntraMUSCular  PRIOR TO DISCHARGE               Review of Symptoms:   Constitutional: negative   Eyes: negative   Ears, nose, mouth, throat, and face: negative   Respiratory: SOB when HR fast   Cardiovascular: palpitations, chest pain   Gastrointestinal: negative   Genitourinary:negative   Musculoskeletal:negative   Neurological: dizzy when HR fast    Behvioral/Psych: negative   Endocrine: negative                   Objective:         Visit Vitals      BP  116/75     Pulse  71     Temp  97.8 ??F (36.6 ??C)     Resp  16     Ht  6\' 2"  (1.88 m)     Wt  99.8 kg (220 lb)     SpO2  95%        BMI  28.25 kg/m??           Physical Assessment:    General Appearance:  alert, cooperative, well nourished, well developed adult AAM resting in bed in NAD; appears stated age   Eyes: sclera anicteric   Mouth/Throat: moist mucous membranes; oral pharynx clear.  Missing dentition    Neck: supple; no JVD or bruit   Pulmonary:  clear to auscultation bilaterally; good effort   Cardiovascular: regular rate and rhythm; no murmur, click, rub, or gallop   Abdomen: soft, non-tender, non-distended; bowel sounds normal   Musculoskeletal: no swelling or deformity; moves all extremities   Extremities: no edema; palpable distal pulses    Skin: warm and dry   Neuro: grossly normal   Psych: normal mood and affect given the  setting         Data Review:      Recent Labs             06/02/20   0433  06/01/20   2244  06/01/20   1941     WBC  5.3  6.3  6.9     HGB  13.1  13.8  13.3     HCT  40.5  41.5  40.6          PLT  144*  174  158          Recent Labs            06/02/20   0433  06/01/20   1941     NA  139  137     K  3.6  3.5     CL  107  106     CO2  28  27     GLU  98  91     BUN  6  6     CREA  0.86  0.91  CA  8.3*  8.7     ALB  3.0*  3.1*     TBILI  1.0  1.3*         ALT  20  23             Recent Labs              06/02/20   0433  06/01/20   2244  06/01/20   2128  06/01/20   1941           TROIQ  0.07*  0.12*  0.13*  0.06*           No intake or output data in the 24 hours ending 06/02/20 0943       Cardiographics      Telemetry: SR   ECG: SR   Echocardiogram: ordered   CXRAY:  No acute process             Assessment:          Active Problems:     Exertional dyspnea (06/01/2020)        NSTEMI (non-ST elevated myocardial infarction) (HCC) (06/01/2020)               Plan:           Ronald Kennedy is a 59 y.o.  male who presented with palpitations, dizziness, chest pain, and SOB with activity.  Referred  to cardiology for elevated troponin.  0.06/0.13/0.12/.07.   proBNP 130.  CXR clear   ??  Sounds as if patient having an intermittent rapid rhythm and is causing his symptoms   ??  Mild troponin elevation may be all from elevated HR or from elevated HR in setting of CAD.     ??  Obtain walking nuclear stress test   ??  Stop heparin drip   ??  If no significant ectopy noted on stress test or during admission, plan for event monitor   ??  Add BB after stress test completed.     ??  Lipid panel reviewed   ??  ASA, statin,      ??  ECHO               [facility]      Milderd Meagerosanna Hastings, NP   DNP, RN, AGACNP-BC      Patient seen and examined by me with the above nurse practitioner.  I personally performed all components of the history, physical, and medical decision making and agree with  the assessment and plan with minor modifications as noted.       Today the patient presents with palpitations, associated with chest pain when they occur, some dyspnea on exertion.      General PE   Gen:  NAD   Mental Status - Alert. General Appearance - Not in acute distress.    HEENT:   PERRL, no carotid bruits or JVD   Chest and Lung Exam    Inspection: Accessory muscles - No use of accessory muscles in breathing.    Auscultation:    Breath sounds: - Normal.    Cardiovascular    Inspection: Jugular vein - Bilateral - Inspection Normal.    Palpation/Percussion:    Apical Impulse: - Normal.    Auscultation: Rhythm - Regular. Heart Sounds - S1 WNL and S2 WNL. No S3 or S4.    Murmurs & Other Heart Sounds: Auscultation of the heart reveals - No Murmurs.  Peripheral Vascular    Upper Extremity: Inspection - Bilateral - No Cyanotic nailbeds or Digital clubbing.    Lower Extremity:    Palpation: Edema - Bilateral - No edema.   Abdomen:   Soft, non-tender, bowel sounds are active.   Neuro: A&O times 3, CN and motor grossly WNL      Nuclear stress test negative for ischemia.  Echo shows normal LVEF with no significant valvular pathology.  Telemetry unrevealing at this point.  Okay for discharge from a cardiac standpoint to obtain an event monitor from our office and follow up with  me after that is resulted.  Recommend aspirin, statin, and beta-blocker.

## 2020-06-02 NOTE — Progress Notes (Signed)
0700 Received report from night shift nurse Abigail.   0800 Completed shift asssessment.  0830 Placed pt on NPO status.  0930 Discontinued pts Heparin drip  0945 Discussed nuclear stress test with pt. Pt is alert and oriented and understands pending procedure.

## 2020-06-03 ENCOUNTER — Inpatient Hospital Stay: Admit: 2020-06-03 | Payer: MEDICARE | Primary: Family Medicine

## 2020-06-03 LAB — CBC WITH AUTO DIFFERENTIAL
Basophils %: 1 % (ref 0–1)
Basophils Absolute: 0 10*3/uL (ref 0.0–0.1)
Eosinophils %: 2 % (ref 0–7)
Eosinophils Absolute: 0.1 10*3/uL (ref 0.0–0.4)
Granulocyte Absolute Count: 0 10*3/uL (ref 0.00–0.04)
Hematocrit: 35.8 % — ABNORMAL LOW (ref 36.6–50.3)
Hemoglobin: 11.7 g/dL — ABNORMAL LOW (ref 12.1–17.0)
Immature Granulocytes: 0 % (ref 0.0–0.5)
Lymphocytes %: 19 % (ref 12–49)
Lymphocytes Absolute: 1 10*3/uL (ref 0.8–3.5)
MCH: 30 PG (ref 26.0–34.0)
MCHC: 32.7 g/dL (ref 30.0–36.5)
MCV: 91.8 FL (ref 80.0–99.0)
MPV: 9.3 FL (ref 8.9–12.9)
Monocytes %: 13 % (ref 5–13)
Monocytes Absolute: 0.7 10*3/uL (ref 0.0–1.0)
NRBC Absolute: 0 10*3/uL (ref 0.00–0.01)
Neutrophils %: 65 % (ref 32–75)
Neutrophils Absolute: 3.5 10*3/uL (ref 1.8–8.0)
Nucleated RBCs: 0 PER 100 WBC
Platelets: 149 10*3/uL — ABNORMAL LOW (ref 150–400)
RBC: 3.9 M/uL — ABNORMAL LOW (ref 4.10–5.70)
RDW: 13.5 % (ref 11.5–14.5)
WBC: 5.4 10*3/uL (ref 4.1–11.1)

## 2020-06-03 LAB — D-DIMER, QUANTITATIVE
D-Dimer, Quant: 17.38 mg/L FEU — ABNORMAL HIGH (ref 0.00–0.65)
D-Dimer, Quant: 10.14 mg/L FEU — ABNORMAL HIGH (ref 0.00–0.65)

## 2020-06-03 LAB — APTT
aPTT: 30.4 s (ref 22.1–31.0)
aPTT: 91.2 s (ref 22.1–31.0)

## 2020-06-03 LAB — CBC WITH AUTOMATED DIFF
ABS. BASOPHILS: 0 10*3/uL (ref 0.0–0.1)
ABS. EOSINOPHILS: 0.1 10*3/uL (ref 0.0–0.4)
ABS. IMM. GRANS.: 0 10*3/uL (ref 0.00–0.04)
ABS. LYMPHOCYTES: 1 10*3/uL (ref 0.8–3.5)
ABS. MONOCYTES: 0.7 10*3/uL (ref 0.0–1.0)
ABS. NEUTROPHILS: 3.5 10*3/uL (ref 1.8–8.0)
ABSOLUTE NRBC: 0 10*3/uL (ref 0.00–0.01)
BASOPHILS: 1 % (ref 0–1)
EOSINOPHILS: 2 % (ref 0–7)
HCT: 35.8 % — ABNORMAL LOW (ref 36.6–50.3)
HGB: 11.7 g/dL — ABNORMAL LOW (ref 12.1–17.0)
IMMATURE GRANULOCYTES: 0 % (ref 0.0–0.5)
LYMPHOCYTES: 19 % (ref 12–49)
MCH: 30 PG (ref 26.0–34.0)
MCHC: 32.7 g/dL (ref 30.0–36.5)
MCV: 91.8 FL (ref 80.0–99.0)
MONOCYTES: 13 % (ref 5–13)
MPV: 9.3 FL (ref 8.9–12.9)
NEUTROPHILS: 65 % (ref 32–75)
NRBC: 0 PER 100 WBC
PLATELET: 149 10*3/uL — ABNORMAL LOW (ref 150–400)
RBC: 3.9 M/uL — ABNORMAL LOW (ref 4.10–5.70)
RDW: 13.5 % (ref 11.5–14.5)
WBC: 5.4 10*3/uL (ref 4.1–11.1)

## 2020-06-03 LAB — PTT
aPTT: 30.4 s (ref 22.1–31.0)
aPTT: 91.2 s — CR (ref 22.1–31.0)

## 2020-06-03 LAB — SAMPLES BEING HELD

## 2020-06-03 LAB — D DIMER
D-dimer: 17.38 mg/L FEU — ABNORMAL HIGH (ref 0.00–0.65)
D-dimer: 10.14 mg/L FEU — ABNORMAL HIGH (ref 0.00–0.65)

## 2020-06-03 MED ORDER — HEPARIN (PORCINE) IN D5W 25,000 UNIT/250 ML IV
25000 unit/250 mL(100 unit/mL) | INTRAVENOUS | Status: DC
Start: 2020-06-03 — End: 2020-06-05
  Administered 2020-06-03 – 2020-06-05 (×8): via INTRAVENOUS

## 2020-06-03 MED ORDER — HEPARIN (PORCINE) 5,000 UNIT/ML IJ SOLN
5000 unit/mL | INTRAMUSCULAR | Status: DC | PRN
Start: 2020-06-03 — End: 2020-06-05

## 2020-06-03 MED ORDER — IOPAMIDOL 76 % IV SOLN
76 % | Freq: Once | INTRAVENOUS | Status: AC
Start: 2020-06-03 — End: 2020-06-03
  Administered 2020-06-03: 13:00:00 via INTRAVENOUS

## 2020-06-03 MED ORDER — HEPARIN (PORCINE) 5,000 UNIT/ML IJ SOLN
5000 unit/mL | Freq: Three times a day (TID) | INTRAMUSCULAR | Status: DC
Start: 2020-06-03 — End: 2020-06-03
  Administered 2020-06-03 (×2): via SUBCUTANEOUS

## 2020-06-03 MED ORDER — HEPARIN (PORCINE) 5,000 UNIT/ML IJ SOLN
5000 unit/mL | Freq: Every day | INTRAMUSCULAR | Status: DC | PRN
Start: 2020-06-03 — End: 2020-06-03

## 2020-06-03 MED ORDER — HEPARIN (PORCINE) 5,000 UNIT/ML IJ SOLN
5000 unit/mL | Freq: Once | INTRAMUSCULAR | Status: AC
Start: 2020-06-03 — End: 2020-06-03
  Administered 2020-06-03: 14:00:00 via INTRAVENOUS

## 2020-06-03 MED ORDER — HEPARIN (PORCINE) 5,000 UNIT/ML IJ SOLN
5000 unit/mL | INTRAMUSCULAR | Status: DC | PRN
Start: 2020-06-03 — End: 2020-06-05
  Administered 2020-06-04 – 2020-06-05 (×3): via INTRAVENOUS

## 2020-06-03 MED FILL — METOPROLOL TARTRATE 25 MG TAB: 25 mg | ORAL | Qty: 1

## 2020-06-03 MED FILL — HEPARIN (PORCINE) 5,000 UNIT/ML IJ SOLN: 5000 unit/mL | INTRAMUSCULAR | Qty: 1

## 2020-06-03 MED FILL — ASPIRIN 81 MG CHEWABLE TAB: 81 mg | ORAL | Qty: 1

## 2020-06-03 MED FILL — ISOVUE-370  76 % INTRAVENOUS SOLUTION: 370 mg iodine /mL (76 %) | INTRAVENOUS | Qty: 100

## 2020-06-03 MED FILL — ATORVASTATIN 40 MG TAB: 40 mg | ORAL | Qty: 1

## 2020-06-03 MED FILL — HEPARIN (PORCINE) 5,000 UNIT/ML IJ SOLN: 5000 unit/mL | INTRAMUSCULAR | Qty: 2

## 2020-06-03 MED FILL — HEPARIN (PORCINE) IN D5W 25,000 UNIT/250 ML IV: 25000 unit/250 mL(100 unit/mL) | INTRAVENOUS | Qty: 250

## 2020-06-03 NOTE — Progress Notes (Signed)
Received notification from bedside RN about patient with regards to: elevated D-dimer (17.38)  VS: BP 128/83, HR 74, RR 18, O2 sat 92% on NC 2 L    Intervention given: CTA in AM, started on Heparin SQ q 8 for anticoagulation

## 2020-06-03 NOTE — Progress Notes (Signed)
[facility] End of Shift Note  Tele:  Significant tele event? none  Time/details of tele event: N/A    I&O/ Daily Weight:  Strict I&O ordered? NO  Fluid restriction ordered: NO  PO intake since midnight: N/A    Daily weight ordered: NO  Today's weight: N/A    Yesterday's weight: N/A   Significant weight gain reported to MD? N/A    Procedure:  NPO order for upcoming test/procedure present? N/A  Procedure/test: N/A  Date of procedure: N/A    Discharge:  Times ambulated in hallways past shift: 0        Times OOB to chair past shift: 0  Plan/Discharge barriers identified?: unknown  Has this patient received a stent this admission? NO  If yes, verify patient has aspirin, antiplatelet, statin, BB, ACE/ARB (for EF < 40%) ordered. N/A  If not ordered, which medication is missing? N/A N/A     Other concerns:  Any nurse/patient concerns to be addressed by MD? N/A    Bedside shift change report given to Carollee Leitz, RN  (oncoming nurse) by Hassel Neth, RN (offgoing nurse). Report included the following information SBAR.     Hassel Neth, RN

## 2020-06-03 NOTE — Progress Notes (Signed)
Problem: Falls - Risk of  Goal: *Absence of Falls  Description: Document Ronald Kennedy Fall Risk and appropriate interventions in the flowsheet.  06/03/2020 0354 by Hassel Neth, RN  Outcome: Progressing Towards Goal  Note: Fall Risk Interventions:  Mobility Interventions: Assess mobility with egress test, Patient to call before getting OOB, Utilize walker, cane, or other assistive device                   History of Falls Interventions: Investigate reason for fall      06/02/2020 1955 by Hassel Neth, RN  Outcome: Progressing Towards Goal  Note: Fall Risk Interventions:  Mobility Interventions: Assess mobility with egress test, Patient to call before getting OOB, Utilize walker, cane, or other assistive device                   History of Falls Interventions: Investigate reason for fall

## 2020-06-03 NOTE — Consults (Signed)
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  Few days PTA -had 2 days of diarrhea and felt like he was dehydrated, no melena or brbpr.  Initially felt to have NSTEMI- started on asa and heparin gtt, subsequently, still hypoxic with ambulation and rising d-dimer led to CT chest angio- revealed large vol b/l PE , involving b/l main pulmonary arteries. B/l LE dopplers showed rle proximal DVT as well.    For the last 6 months- he has felt a swelling on his left lower back- along the paraspinal musculature- he attributes it to a fall . No wt loss, last c scope-4-5 yrs ago. Last PSA/prostate exam - several years ago. + Headaches and blurring of vision with near syncopal sensation when hemsi arrived, but now all resolved. No speech problems or weakness in extremities.  We are asked to consult re: the same    Impression/Plan:  *)Unprovoked symptomatic large volume b/l Pulmonary embolism with RLE proximal, main and distal  femoral and popliteal vein DVT  --Agree with current anticoagulation plan with heparin. If renal function normal, once stable from clinical standpoint, can transition to an oral anticoagulant -DOAC/NOAC ( Apixaban vs rivaroxaban) or if latter unaffordable, warfarin  --Etiology: Unclear yet, unprovoked, except minor provocation of diarrheal illness with dehydration preceding the episode, at his age , keep occult malignancy in mind and may consider a hypercoag w/u as an outpatient, if no malignancy is found.   --Duration of anticoagulation:Usually for provoked events-3-6 months , with option of extended low dose anticoagulation for 1 year, but in this  gentleman with otherwise unprovoked large volume PE, can consider indefinite anticoagulation .  -Tolerating anticoagulation well, feels better, dont see an indication for an ivc filter.    *)Anemia- w normal MCV   --Hb - on admission was 13.3/40--> 11.7 today ( may have been hemoconcentrated on admission)  --Monitor, will add basic anemia labs and assess for myeloma labs, stool fob    *)Mild thrombocytopenia  --On admit 158k-->179k--149 --watch carefuuly on heparin gtt, add retic, LDH, haptoglobin    *)Left psoas swelling - with unprovoked VTE  --Proceed with CT a/p w and w/o iv con- assess that area of palpable swelling, and r/o other malignancy . CT chest did not reveal any obvious maliganancy.    *)Age appropriate cancer screening  --last Colonoscopy - 4-5 years ago, last prostate exam and PSA few years back    He prefers our [place] location for outpatient f/u. Will request my [facility] colleagues to follow him as outpatient. Emphasized need for Heme f/u and the need to establish with a pcp as well.    Thanks much for the consultation. [facility] is following.    Imaging:  Reviewed CXR, CTA chest, 2 d echo     Labs:  Recent Results (from the past 24 hour(s))   D DIMER    Collection Time: 06/02/20 10:55 PM   Result Value Ref Range    D-dimer 17.38 (H) 0.00 - 0.65 mg/L FEU   PTT    Collection Time: 06/03/20  9:21 AM   Result  Value Ref Range    aPTT 30.4 22.1 - 31.0 sec    aPTT, therapeutic range     58.0 - 77.0 SECS   CBC WITH AUTOMATED DIFF    Collection Time: 06/03/20  9:21 AM   Result Value Ref Range    WBC 5.4 4.1 - 11.1 K/uL    RBC 3.90 (L) 4.10 - 5.70 M/uL    HGB 11.7 (L) 12.1 - 17.0 g/dL    HCT 85.8 (L) 85.0 - 50.3 %    MCV 91.8 80.0 - 99.0 FL    MCH 30.0 26.0 - 34.0 PG    MCHC 32.7 30.0 - 36.5 g/dL    RDW 27.7 41.2 - 87.8 %    PLATELET 149 (L) 150 - 400 K/uL    MPV 9.3 8.9 - 12.9 FL    NRBC 0.0 0 PER 100 WBC    ABSOLUTE NRBC 0.00 0.00 - 0.01 K/uL    NEUTROPHILS 65 32 - 75 %    LYMPHOCYTES 19 12 - 49 %     MONOCYTES 13 5 - 13 %    EOSINOPHILS 2 0 - 7 %    BASOPHILS 1 0 - 1 %    IMMATURE GRANULOCYTES 0 0.0 - 0.5 %    ABS. NEUTROPHILS 3.5 1.8 - 8.0 K/UL    ABS. LYMPHOCYTES 1.0 0.8 - 3.5 K/UL    ABS. MONOCYTES 0.7 0.0 - 1.0 K/UL    ABS. EOSINOPHILS 0.1 0.0 - 0.4 K/UL    ABS. BASOPHILS 0.0 0.0 - 0.1 K/UL    ABS. IMM. GRANS. 0.0 0.00 - 0.04 K/UL    DF AUTOMATED     D DIMER    Collection Time: 06/03/20  9:21 AM   Result Value Ref Range    D-dimer 10.14 (H) 0.00 - 0.65 mg/L FEU   SAMPLES BEING HELD    Collection Time: 06/03/20  9:21 AM   Result Value Ref Range    SAMPLES BEING HELD BLU     COMMENT        Add-on orders for these samples will be processed based on acceptable specimen integrity and analyte stability, which may vary by analyte.       History:  History reviewed. No pertinent past medical history.   Past Surgical History:   Procedure Laterality Date   ??? HX ORTHOPAEDIC      left knee for torn ligament      Prior to Admission medications    Not on File     No Known Allergies   Social History     Tobacco Use   ??? Smoking status: Never Smoker   ??? Smokeless tobacco: Never Used   Substance Use Topics   ??? Alcohol use: Yes     Comment: occ      History reviewed. No pertinent family history.     Current Medications:  Current Facility-Administered Medications   Medication Dose Route Frequency   ??? heparin 25,000 units in D5W 250 ml infusion  15-36 Units/kg/hr IntraVENous TITRATE   ??? heparin (porcine) injection 4,000 Units  40 Units/kg IntraVENous PRN   ??? heparin (porcine) injection 8,000 Units  80 Units/kg IntraVENous PRN   ??? metoprolol tartrate (LOPRESSOR) tablet 25 mg  25 mg Oral Q12H   ??? sodium chloride (NS) flush 5-40 mL  5-40 mL IntraVENous Q8H   ??? sodium chloride (NS) flush 5-40 mL  5-40 mL IntraVENous PRN   ??? acetaminophen (TYLENOL) tablet 650 mg  650 mg Oral  Q6H PRN    Or   ??? acetaminophen (TYLENOL) suppository 650 mg  650 mg Rectal Q6H PRN   ??? polyethylene glycol (MIRALAX) packet 17 g  17 g Oral DAILY PRN   ???  promethazine (PHENERGAN) tablet 12.5 mg  12.5 mg Oral Q6H PRN    Or   ??? ondansetron (ZOFRAN) injection 4 mg  4 mg IntraVENous Q6H PRN   ??? aspirin chewable tablet 81 mg  81 mg Oral DAILY   ??? atorvastatin (LIPITOR) tablet 40 mg  40 mg Oral QHS   ??? nitroglycerin (NITROSTAT) tablet 0.4 mg  0.4 mg SubLINGual PRN   ??? COVID-19 vac,Ad26-PF (JANSSEN) injection 1 Dose  1 Dose IntraMUSCular PRIOR TO DISCHARGE         Review of Systems:  12 point ROS done. Negative except for symptoms mentioned in HPI.    Physical Exam:  MS-Alert, or X 3  General -Well nourished,   Poor oral hygiene and dentition  Neck-Supple, No JVD  CVS-S1,S2 normal  RS-CTA b/l, few rales at bases- cleared with coughing.  Abdomen- Soft, NT,ND, BS+  Left paraspinous muscle swelling -palpable++- asymetric compared to right side.  Extre- No c/c/e. No calf tenderness  Neuro- No FND.

## 2020-06-03 NOTE — Progress Notes (Signed)
Progress Notes by Minette Brine, MD at 06/03/20 [phone]                Author: Minette Brine, MD  Service: Internal Medicine  Author Type: Physician       Filed: 06/03/20 1205  Date of Service: 06/03/20 0733  Status: Signed          Editor: Minette Brine, MD (Physician)                       Hospitalist Progress Note      NAME: Ronald Kennedy    DOB:  10/25/1960    MRN:  [id number]          Assessment / Plan:     Acute respiratory failure with hypoxia    Large b/l PE unprovoked   D-dimer was elevated, patient hypoxic upon ambulation   CT of the chest showed large bilateral PE, started on heparin drip   2D echo was negative for RV strain   Vital signs are stable and patient is not hypoxic upon at rest   Check lower extremity Doppler and consult heme-onc      NSTEMI, POA ruled out   Syncopal episode   Anginal chest pain, POA   Exertional dyspnea, POA   Tobacco smoking, POA   Hypoxia upon ambulation per RN   -Presented with anginal chest pain in nature and exertional dyspnea.  Reported diaphoresis while experiencing chest pain   -EKG with no acute ischemic changes   -Initial troponin 0 0.06, full troponin every 6 hours   -Chest pain-free   -TIMI score of 2 (positive cardiac enzymes, 2 episodes of chest pain in the last 24 hours)   -Active smoker is a risk factor for coronary artery disease.  Counseling was provided about tobacco cessation   Status post heparin drip, continue with aspirin   Had a negative stress test   Plan for event monitor as outpatient         Code Status: Full code   ??   ??   DVT Prophylaxis: Lovenox   GI Prophylaxis: not indicated   ??   Baseline: Active, independent         25.0 - 29.9 Overweight  / Body mass index is 28.25 kg/m??.      Estimated discharge date: September 20   Barriers:      Code status: Full   Prophylaxis: Heparin drip   Recommended Disposition: Home w/Family          Subjective:        Chief Complaint / Reason for Physician Visit   Follow-up syncope discussed with RN events  overnight.    No acute issues, CT came back positive for large bilateral PE      Review of Systems:           Symptom  Y/N  Comments    Symptom  Y/N  Comments             Fever/Chills        Chest Pain  n               Poor Appetite        Edema                 Cough        Abdominal Pain  n       Sputum        Joint Pain  SOB/DOE  n      Pruritis/Rash         Nausea/vomit        Tolerating PT/OT         Diarrhea        Tolerating Diet  y               Constipation        Other               Could NOT obtain due to:            Objective:        VITALS:    Last 24hrs VS reviewed since prior progress note. Most recent are:   Patient Vitals for the past 24 hrs:            Temp  Pulse  Resp  BP  SpO2            06/03/20 0726  98.3 ??F (36.8 ??C)  76  18  (!) 129/98  95 %            06/03/20 0323  99.2 ??F (37.3 ??C)  80  18  124/85  91 %     06/02/20 2227  98.3 ??F (36.8 ??C)  74  18  128/83  92 %     06/02/20 2100  --  75  --  134/89  91 %     06/02/20 2000  --  86  --  117/73  (!) 88 %     06/02/20 1900  98.3 ??F (36.8 ??C)  86  16  131/81  91 %     06/02/20 1500  --  86  --  (!) 138/92  100 %     06/02/20 0950  --  --  --  --  90 %            06/02/20 0757  --  --  --  116/75  --           Intake/Output Summary (Last 24 hours) at 06/03/2020 0733   Last data filed at 06/03/2020 0726     Gross per 24 hour        Intake  240 ml        Output  700 ml        Net  -460 ml            I had a face to face encounter and independently examined this patient on 06/03/2020, as outlined below:   PHYSICAL EXAM:   General: WD, WN. Alert, cooperative, no acute distress     EENT:  EOMI. Anicteric sclerae. MMM   Resp:  CTA bilaterally, no wheezing or rales.  No accessory muscle use   CV:  Regular  rhythm,  No edema   GI:  Soft, Non distended, Non tender.  +Bowel sounds   Neurologic:  Alert and oriented X 3, normal speech,    Psych:   Good insight. Not anxious nor agitated   Skin:  No rashes.  No jaundice      Reviewed most current lab test  results and cultures  YES   Reviewed most current radiology test results   YES   Review and summation of old records today    NO   Reviewed patient's current orders and MAR    YES   PMH/SH reviewed - no change compared to H&P   ________________________________________________________________________   Care Plan discussed with:  Comments         Patient  x           Family              RN  x       Care Manager             Consultant                                  Multidiciplinary team rounds were held today with case manager, nursing, pharmacist and Higher education careers adviser.  Patient's plan of care was discussed; medications  were reviewed and discharge planning was addressed.       ________________________________________________________________________   Total NON critical care TIME:35  Minutes      Total CRITICAL CARE TIME Spent:   Minutes non procedure based              Comments         >50% of visit spent in counseling and coordination of care         ________________________________________________________________________   Minette Brine, MD       Procedures: see electronic medical records for all procedures/Xrays and details which were not copied into this note but were reviewed prior to creation of Plan.        LABS:   I reviewed today's most current labs and imaging studies.   Pertinent labs include:     Recent Labs             06/02/20   0433  06/01/20   2244  06/01/20   1941     WBC  5.3  6.3  6.9     HGB  13.1  13.8  13.3     HCT  40.5  41.5  40.6          PLT  144*  174  158          Recent Labs            06/02/20   0433  06/01/20   1941     NA  139  137     K  3.6  3.5     CL  107  106     CO2  28  27     GLU  98  91     BUN  6  6     CREA  0.86  0.91     CA  8.3*  8.7     ALB  3.0*  3.1*     TBILI  1.0  1.3*         ALT  20  23           Signed: Adam Sanjuan Geoffry Paradise, MD

## 2020-06-03 NOTE — Progress Notes (Signed)
Problem: Falls - Risk of  Goal: *Absence of Falls  Description: Document Ronald Kennedy Fall Risk and appropriate interventions in the flowsheet.  Outcome: Progressing Towards Goal  Note: Fall Risk Interventions:  Mobility Interventions: Assess mobility with egress test, Patient to call before getting OOB, Utilize walker, cane, or other assistive device         Medication Interventions: Teach patient to arise slowly         History of Falls Interventions: Investigate reason for fall

## 2020-06-04 ENCOUNTER — Inpatient Hospital Stay: Admit: 2020-06-04 | Payer: MEDICARE | Primary: Family Medicine

## 2020-06-04 LAB — IRON AND TIBC
Iron Saturation: 20 % (ref 20–50)
Iron: 60 ug/dL (ref 35–150)
TIBC: 297 ug/dL (ref 250–450)

## 2020-06-04 LAB — APTT
aPTT: 48.4 s — ABNORMAL HIGH (ref 22.1–31.0)
aPTT: 124.3 s (ref 22.1–31.0)
aPTT: 32.4 s — ABNORMAL HIGH (ref 22.1–31.0)
aPTT: 34.7 s — ABNORMAL HIGH (ref 22.1–31.0)

## 2020-06-04 LAB — PSA SCREENING: Pros. Spec. Antigen: 2.4 ng/mL (ref 0.01–4.0)

## 2020-06-04 LAB — RETICULOCYTE COUNT
Absolute Retic Cnt.: 0.0698 M/ul (ref 0.0260–0.0950)
Absolute Retic Cnt.: 0.0698 M/ul (ref 0.0260–0.0950)
Retic Ct Pct: 1.7 % (ref 0.7–2.1)
Reticulocyte count: 1.7 % (ref 0.7–2.1)

## 2020-06-04 LAB — VITAMIN B12
Vitamin B-12: 283 pg/mL (ref 193–986)
Vitamin B12: 283 pg/mL (ref 193–986)

## 2020-06-04 LAB — LACTATE DEHYDROGENASE: LD: 221 U/L (ref 85–241)

## 2020-06-04 LAB — FERRITIN
Ferritin: 93 NG/ML (ref 26–388)
Ferritin: 93 NG/ML (ref 26–388)

## 2020-06-04 LAB — HAPTOGLOBIN
HAPTOGLOBIN, HAPGB: 206 mg/dL — ABNORMAL HIGH (ref 30–200)
Haptoglobin: 206 mg/dL — ABNORMAL HIGH (ref 30–200)

## 2020-06-04 LAB — FOLATE
Folate: 7.1 ng/mL (ref 5.0–21.0)
Folate: 7.1 ng/mL (ref 5.0–21.0)

## 2020-06-04 LAB — IRON PROFILE
Iron % saturation: 20 % (ref 20–50)
Iron: 60 ug/dL (ref 35–150)
TIBC: 297 ug/dL (ref 250–450)

## 2020-06-04 LAB — PSA SCREENING (SCREENING): Prostate Specific Ag: 2.4 ng/mL (ref 0.01–4.0)

## 2020-06-04 LAB — PTT
aPTT: 48.4 s — ABNORMAL HIGH (ref 22.1–31.0)
aPTT: 124.3 s — CR (ref 22.1–31.0)
aPTT: 32.4 s — ABNORMAL HIGH (ref 22.1–31.0)
aPTT: 34.7 s — ABNORMAL HIGH (ref 22.1–31.0)

## 2020-06-04 LAB — LD: LD: 221 U/L (ref 85–241)

## 2020-06-04 MED ORDER — CYANOCOBALAMIN 1,000 MCG/ML IJ SOLN
1000 mcg/mL | Freq: Every day | INTRAMUSCULAR | Status: DC
Start: 2020-06-04 — End: 2020-06-05
  Administered 2020-06-04 – 2020-06-05 (×2): via SUBCUTANEOUS

## 2020-06-04 MED ORDER — IOPAMIDOL 76 % IV SOLN
370 mg iodine /mL (76 %) | Freq: Once | INTRAVENOUS | Status: AC
Start: 2020-06-04 — End: 2020-06-04
  Administered 2020-06-04: 22:00:00 via INTRAVENOUS

## 2020-06-04 MED FILL — METOPROLOL TARTRATE 25 MG TAB: 25 mg | ORAL | Qty: 1

## 2020-06-04 MED FILL — HEPARIN (PORCINE) 5,000 UNIT/ML IJ SOLN: 5000 unit/mL | INTRAMUSCULAR | Qty: 1

## 2020-06-04 MED FILL — ASPIRIN 81 MG CHEWABLE TAB: 81 mg | ORAL | Qty: 1

## 2020-06-04 MED FILL — HEPARIN (PORCINE) IN D5W 25,000 UNIT/250 ML IV: 25000 unit/250 mL(100 unit/mL) | INTRAVENOUS | Qty: 250

## 2020-06-04 MED FILL — HEPARIN (PORCINE) 5,000 UNIT/ML IJ SOLN: 5000 unit/mL | INTRAMUSCULAR | Qty: 2

## 2020-06-04 MED FILL — CYANOCOBALAMIN 1,000 MCG/ML IJ SOLN: 1000 mcg/mL | INTRAMUSCULAR | Qty: 1

## 2020-06-04 NOTE — Progress Notes (Signed)
During beside shift report with Marta Lamas this nurse notice a covid vaccine was due prior to discharge. Discussed with patient and he stated he did not want to get the vaccine

## 2020-06-04 NOTE — Progress Notes (Signed)
Problem: Falls - Risk of  Goal: *Absence of Falls  Description: Document Ronald Kennedy Fall Risk and appropriate interventions in the flowsheet.  06/04/2020 0346 by Hassel Neth, RN  Outcome: Progressing Towards Goal  Note: Fall Risk Interventions:  Mobility Interventions: Assess mobility with egress test, Patient to call before getting OOB, Utilize walker, cane, or other assistive device         Medication Interventions: Teach patient to arise slowly         History of Falls Interventions: Investigate reason for fall      06/03/2020 1911 by Hassel Neth, RN  Outcome: Progressing Towards Goal  Note: Fall Risk Interventions:  Mobility Interventions: Assess mobility with egress test, Patient to call before getting OOB, Utilize walker, cane, or other assistive device         Medication Interventions: Teach patient to arise slowly         History of Falls Interventions: Investigate reason for fall

## 2020-06-04 NOTE — Progress Notes (Signed)
Progress Notes by Minette Brine, MD at 06/04/20 [phone]                Author: Minette Brine, MD  Service: Internal Medicine  Author Type: Physician       Filed: 06/04/20 1038  Date of Service: 06/04/20 0753  Status: Signed          Editor: Minette Brine, MD (Physician)                       Hospitalist Progress Note      NAME: Ronald Kennedy    DOB:  December 13, 1960    MRN:  [id number]          Assessment / Plan:     Acute respiratory failure with hypoxia    Large b/l PE unprovoked   RLE DVT    D-dimer was elevated, patient hypoxic upon ambulation   CT of the chest showed large bilateral PE, started on heparin drip   2D echo was negative for RV strain   Vital signs are stable and patient is not hypoxic upon at rest   Check lower extremity Doppler and consult heme-onc   09/19:   Venous doppler showed :   Acute thrombus present in the right femoral vein. Acute thrombus present in the right proximal femoral vein. Acute thrombus present in the right mid femoral vein.  Acute thrombus present in the right distal femoral vein. Acute thrombus present in the right popliteal vein.    Appreciate heme-onc input, will switch to Eliquis prior to discharge check CT abdomen and pelvis to rule out malignancy      NSTEMI, POA ruled out   Syncopal episode   Anginal chest pain, POA   Exertional dyspnea, POA   Tobacco smoking, POA   Hypoxia upon ambulation per RN   -Presented with anginal chest pain in nature and exertional dyspnea.  Reported diaphoresis while experiencing chest pain   -EKG with no acute ischemic changes   -Initial troponin 0 0.06, full troponin every 6 hours   -Chest pain-free   -TIMI score of 2 (positive cardiac enzymes, 2 episodes of chest pain in the last 24 hours)   -Active smoker is a risk factor for coronary artery disease.  Counseling was provided about tobacco cessation   ontinue with aspirin   Had a negative stress test   Plan for event monitor as outpatient         Code Status: Full code   ??   ??   DVT  Prophylaxis: Lovenox   GI Prophylaxis: not indicated   ??   Baseline: Active, independent         25.0 - 29.9 Overweight  / Body mass index is 28.25 kg/m??.      Estimated discharge date: September 20   Barriers:      Code status: Full   Prophylaxis: Heparin drip   Recommended Disposition: Home w/Family          Subjective:        Chief Complaint / Reason for Physician Visit   Follow-up PE   Patient reported feeling better   Review of Systems:           Symptom  Y/N  Comments    Symptom  Y/N  Comments             Fever/Chills        Chest Pain  n  Poor Appetite        Edema                 Cough        Abdominal Pain  n       Sputum        Joint Pain         SOB/DOE  n      Pruritis/Rash         Nausea/vomit        Tolerating PT/OT         Diarrhea        Tolerating Diet  y               Constipation        Other               Could NOT obtain due to:            Objective:        VITALS:    Last 24hrs VS reviewed since prior progress note. Most recent are:   Patient Vitals for the past 24 hrs:            Temp  Pulse  Resp  BP  SpO2            06/04/20 0352  98.6 ??F (37 ??C)  (!) 52  18  132/81  92 %            06/03/20 2220  98.4 ??F (36.9 ??C)  64  18  (!) 141/87  96 %     06/03/20 1901  99.4 ??F (37.4 ??C)  71  18  123/73  95 %     06/03/20 1556  97.9 ??F (36.6 ??C)  66  18  (!) 130/90  91 %            06/03/20 1150  98.4 ??F (36.9 ??C)  64  18  (!) 126/90  97 %           Intake/Output Summary (Last 24 hours) at 06/04/2020 0753   Last data filed at 06/04/2020 0354     Gross per 24 hour        Intake  240 ml        Output  1450 ml        Net  -1210 ml            I had a face to face encounter and independently examined this patient on 06/04/2020, as outlined below:   PHYSICAL EXAM:   General: WD, WN. Alert, cooperative, no acute distress     EENT:  EOMI. Anicteric sclerae. MMM   Resp:  CTA bilaterally, no wheezing or rales.  No accessory muscle use   CV:  Regular  rhythm,  No edema   GI:  Soft, Non distended, Non  tender.  +Bowel sounds   Neurologic:  Alert and oriented X 3, normal speech,    Psych:   Good insight. Not anxious nor agitated   Skin:  No rashes.  No jaundice      Reviewed most current lab test results and cultures  YES   Reviewed most current radiology test results   YES   Review and summation of old records today    NO   Reviewed patient's current orders and MAR    YES   PMH/SH reviewed - no change compared to H&P   ________________________________________________________________________   Care Plan discussed with:  Comments         Patient  x           Family              RN  x       Care Manager             Consultant                                  Multidiciplinary team rounds were held today with case manager, nursing, pharmacist and Higher education careers adviser.  Patient's plan of care was discussed; medications  were reviewed and discharge planning was addressed.       ________________________________________________________________________   Total NON critical care TIME:35  Minutes      Total CRITICAL CARE TIME Spent:   Minutes non procedure based              Comments         >50% of visit spent in counseling and coordination of care         ________________________________________________________________________   Minette Brine, MD       Procedures: see electronic medical records for all procedures/Xrays and details which were not copied into this note but were reviewed prior to creation of Plan.        LABS:   I reviewed today's most current labs and imaging studies.   Pertinent labs include:     Recent Labs             06/03/20   0921  06/02/20   0433  06/01/20   2244     WBC  5.4  5.3  6.3     HGB  11.7*  13.1  13.8     HCT  35.8*  40.5  41.5          PLT  149*  144*  174          Recent Labs            06/02/20   0433  06/01/20   1941     NA  139  137     K  3.6  3.5     CL  107  106     CO2  28  27     GLU  98  91     BUN  6  6     CREA  0.86  0.91     CA  8.3*  8.7     ALB  3.0*  3.1*      TBILI  1.0  1.3*         ALT  20  23           Signed: Vivica Dobosz Geoffry Paradise, MD

## 2020-06-04 NOTE — Progress Notes (Signed)
[facility] Progress Note      Ronald Kennedy  17-Apr-1961  [id number]      06/04/2020  12:56 PM    Follow-up for: Acute b/l PE and RLE DVT     [x]  Chart notes since last visit reviewed     [x]  Medications reviewed for allergies and interactions    Patient complains of the following:   Feels quite well. No new complaints    Subjective:     12 point ROS negative except as in HPI and above      Objective:     Patient Vitals for the past 24 hrs:   BP Temp Pulse Resp SpO2   06/04/20 1115 119/77 98 ??F (36.7 ??C) (!) 50 16 98 %   06/04/20 0720 125/73 97.9 ??F (36.6 ??C) 60 18 92 %   06/04/20 0352 132/81 98.6 ??F (37 ??C) (!) 52 18 92 %   06/03/20 2220 (!) 141/87 98.4 ??F (36.9 ??C) 64 18 96 %   06/03/20 1901 123/73 99.4 ??F (37.4 ??C) 71 18 95 %   06/03/20 1556 (!) 130/90 97.9 ??F (36.6 ??C) 66 18 91 %       Physical Exam:  MS-Alert, or X 3  General -Well nourished,   Poor oral hygiene and dentition  Neck-Supple, No JVD  CVS-S1,S2 normal  RS-CTA b/l, few rales at bases- cleared with coughing.  Abdomen- Soft, NT,ND, BS+  Left paraspinous muscle swelling -palpable++- asymetric compared to right side.  Extre- No c/c/e. No calf tenderness  Neuro- No FND.  ??    Available labs reviewed:  Labs:    Recent Results (from the past 24 hour(s))   PTT    Collection Time: 06/03/20  3:50 PM   Result Value Ref Range    aPTT 91.2 (HH) 22.1 - 31.0 sec    aPTT, therapeutic range     58.0 - 77.0 SECS   PTT    Collection Time: 06/03/20 11:46 PM   Result Value Ref Range    aPTT 48.4 (H) 22.1 - 31.0 sec    aPTT, therapeutic range     58.0 - 77.0 SECS   RETICULOCYTE COUNT    Collection Time: 06/04/20  1:42 AM   Result Value Ref Range    Reticulocyte count 1.7 0.7 - 2.1 %    Absolute Retic Cnt. 0.0698 0.0260 - 0.0950 M/ul   LD    Collection Time: 06/04/20  1:42 AM   Result Value Ref Range    LD 221 85 - 241 U/L   HAPTOGLOBIN    Collection Time: 06/04/20  1:42 AM   Result Value Ref Range    Haptoglobin 206 (H) 30 - 200 mg/dL   FERRITIN    Collection  Time: 06/04/20  1:42 AM   Result Value Ref Range    Ferritin 93 26 - 388 NG/ML   IRON PROFILE    Collection Time: 06/04/20  1:42 AM   Result Value Ref Range    Iron 60 35 - 150 ug/dL    TIBC 06/06/20 06/06/20 - 196 ug/dL    Iron % saturation 20 20 - 50 %   VITAMIN B12    Collection Time: 06/04/20  1:42 AM   Result Value Ref Range    Vitamin B12 283 193 - 986 pg/mL   FOLATE    Collection Time: 06/04/20  1:42 AM   Result Value Ref Range    Folate 7.1 5.0 - 21.0 ng/mL   PSA SCREENING (SCREENING)  Collection Time: 06/04/20  1:42 AM   Result Value Ref Range    Prostate Specific Ag 2.4 0.01 - 4.0 ng/mL   PTT    Collection Time: 06/04/20  7:58 AM   Result Value Ref Range    aPTT 124.3 (HH) 22.1 - 31.0 sec    aPTT, therapeutic range     58.0 - 77.0 SECS   PTT    Collection Time: 06/04/20 11:01 AM   Result Value Ref Range    aPTT 32.4 (H) 22.1 - 31.0 sec    aPTT, therapeutic range     58.0 - 77.0 SECS       Imaging:  CXR Results  (Last 48 hours)    None        CT Results  (Last 48 hours)               06/03/20 0842  CTA CHEST W OR W WO CONT Final result    Impression:  Large bilateral pulmonary emboli. Patient's physician notified   patient's nurse notified       Narrative:  Clinical indication: Elevated d-dimer.       Localizer obtained without contrast at the level of the pulmonary arteries. Fast   injection rate of 100 cc of Isovue 370 with review of the raw data and MIP   reconstructions.   CT dose reduction was achieved through the use of a standardized protocol   tailored for this examination and automatic exposure control for dose modulation   .         Large bilater.al pulmonary emboli seen in the main pulmonary arteries. No   definite evidence of right heart strain. No pericardial or pleural effusions. No   shift or pneumothorax. No adenopathy. By basilar platelike atelectasis. No   definite mass. Next                   Assessment/Plan:   *)Unprovoked symptomatic large volume b/l Pulmonary embolism with RLE proximal, main  and distal  femoral and popliteal vein DVT  --Agree with current anticoagulation plan with heparin. If renal function normal, once stable from clinical standpoint, can transition to an oral anticoagulant -DOAC/NOAC ( Apixaban vs rivaroxaban) or if latter unaffordable, warfarin  --Etiology: Unclear yet, unprovoked, except minor provocation of diarrheal illness with dehydration preceding the episode, at his age , keep occult malignancy in mind and may consider a hypercoag w/u as an outpatient, if no malignancy is found.   --Duration of anticoagulation:Usually for provoked events-3-6 months , with option of extended low dose anticoagulation for 1 year, but in this gentleman with otherwise unprovoked large volume PE, can consider indefinite anticoagulation .  -Tolerating anticoagulation well, feels better, dont see an indication for an ivc filter.  ??  *)Anemia- w normal MCV   --Hb - on admission was 13.3/40--> 11.7 today ( may have been hemoconcentrated on admission)  --Reviewed anemia labs- Retic 1.7%, LDH 221 , B12 273 ( low), folate - ok, Fe studies- ferritin -90, s. fe-60, tibc- 297, paraproteins -P  --Check MMA, homocysteine, anti-parietal cell and anti-IF ab  --Start B12 injections - 1000 mcg s.c daily  --Monitor, no cbc today  ??  *)Mild thrombocytopenia  --On admit 158k-->179k--149 --watch carefuuly on heparin gtt,--haptoglobin- 206  --Could be from B12 def  ??  *)Left psoas swelling - with unprovoked VTE  --Proceed with CT a/p w and w/o iv con- assess that area of palpable swelling, and r/o other malignancy . CT chest  did not reveal any obvious maliganancy- Pending  ??  *)Age appropriate cancer screening  --last Colonoscopy - 4-5 years ago, last prostate exam and PSA few years back  ??  He prefers our [place] location for outpatient f/u.  Emphasized need for Heme f/u and the need to establish with a pcp as well.  ??  Thanks much for the consultation. [facility] is following.  ??

## 2020-06-04 NOTE — Progress Notes (Signed)
Received notification from bedside RN about patient with regards to: gas pain, requesting medication for relief  VS: BP 128/80, HR 52, RR 16, O2 sat 96% on RA    Intervention given: Mylanta PO prn ordered

## 2020-06-05 LAB — CBC
Hematocrit: 38.3 % (ref 36.6–50.3)
Hemoglobin: 12.5 g/dL (ref 12.1–17.0)
MCH: 30.2 PG (ref 26.0–34.0)
MCHC: 32.6 g/dL (ref 30.0–36.5)
MCV: 92.5 FL (ref 80.0–99.0)
MPV: 9.8 FL (ref 8.9–12.9)
NRBC Absolute: 0 10*3/uL (ref 0.00–0.01)
Nucleated RBCs: 0 PER 100 WBC
Platelets: 182 10*3/uL (ref 150–400)
RBC: 4.14 M/uL (ref 4.10–5.70)
RDW: 13.5 % (ref 11.5–14.5)
WBC: 5.5 10*3/uL (ref 4.1–11.1)

## 2020-06-05 LAB — HOMOCYSTEINE, PLASMA
HOMOCYSTEINE, PLASMA: 14.2 umol/L — ABNORMAL HIGH (ref 3.7–13.9)
Homocysteine, plasma: 14.2 umol/L — ABNORMAL HIGH (ref 3.7–13.9)

## 2020-06-05 LAB — APTT: aPTT: 46.9 s — ABNORMAL HIGH (ref 22.1–31.0)

## 2020-06-05 LAB — PTT: aPTT: 46.9 s — ABNORMAL HIGH (ref 22.1–31.0)

## 2020-06-05 LAB — CBC W/O DIFF
ABSOLUTE NRBC: 0 10*3/uL (ref 0.00–0.01)
HCT: 38.3 % (ref 36.6–50.3)
HGB: 12.5 g/dL (ref 12.1–17.0)
MCH: 30.2 PG (ref 26.0–34.0)
MCHC: 32.6 g/dL (ref 30.0–36.5)
MCV: 92.5 FL (ref 80.0–99.0)
MPV: 9.8 FL (ref 8.9–12.9)
NRBC: 0 PER 100 WBC
PLATELET: 182 10*3/uL (ref 150–400)
RBC: 4.14 M/uL (ref 4.10–5.70)
RDW: 13.5 % (ref 11.5–14.5)
WBC: 5.5 10*3/uL (ref 4.1–11.1)

## 2020-06-05 MED ORDER — APIXABAN 5 MG TABLET
5 mg | ORAL_TABLET | Freq: Two times a day (BID) | ORAL | 0 refills | Status: AC
Start: 2020-06-05 — End: 2020-06-12

## 2020-06-05 MED ORDER — ATORVASTATIN 40 MG TAB
40 mg | ORAL_TABLET | Freq: Every evening | ORAL | 2 refills | Status: AC
Start: 2020-06-05 — End: 2020-09-03

## 2020-06-05 MED ORDER — ALUM-MAG HYDROXIDE-SIMETH 200 MG-200 MG-20 MG/5 ML ORAL SUSP
200-200-20 mg/5 mL | ORAL | Status: DC | PRN
Start: 2020-06-05 — End: 2020-06-05
  Administered 2020-06-05: 01:00:00 via ORAL

## 2020-06-05 MED ORDER — ASPIRIN 81 MG CHEWABLE TAB
81 mg | ORAL_TABLET | Freq: Every day | ORAL | 2 refills | Status: AC
Start: 2020-06-05 — End: 2020-09-04

## 2020-06-05 MED ORDER — METOPROLOL TARTRATE 25 MG TAB
25 mg | ORAL_TABLET | Freq: Two times a day (BID) | ORAL | 2 refills | Status: AC
Start: 2020-06-05 — End: 2020-09-03

## 2020-06-05 MED ORDER — APIXABAN 5 MG TABLET
5 mg | Freq: Two times a day (BID) | ORAL | Status: DC
Start: 2020-06-05 — End: 2020-06-05

## 2020-06-05 MED ORDER — APIXABAN 5 MG TABLET
5 mg | ORAL_TABLET | Freq: Two times a day (BID) | ORAL | 2 refills | Status: DC
Start: 2020-06-05 — End: 2020-06-05

## 2020-06-05 MED ORDER — APIXABAN 5 MG TABLET
5 mg | ORAL_TABLET | Freq: Two times a day (BID) | ORAL | 5 refills | Status: AC
Start: 2020-06-05 — End: 2020-12-09

## 2020-06-05 MED ORDER — APIXABAN 5 MG TABLET
5 mg | Freq: Two times a day (BID) | ORAL | Status: DC
Start: 2020-06-05 — End: 2020-06-05
  Administered 2020-06-05: 12:00:00 via ORAL

## 2020-06-05 MED FILL — MAG-AL PLUS 200 MG-200 MG-20 MG/5 ML ORAL SUSPENSION: 200-200-20 mg/5 mL | ORAL | Qty: 30

## 2020-06-05 MED FILL — ATORVASTATIN 40 MG TAB: 40 mg | ORAL | Qty: 1

## 2020-06-05 MED FILL — CYANOCOBALAMIN 1,000 MCG/ML IJ SOLN: 1000 mcg/mL | INTRAMUSCULAR | Qty: 1

## 2020-06-05 MED FILL — ASPIRIN 81 MG CHEWABLE TAB: 81 mg | ORAL | Qty: 1

## 2020-06-05 MED FILL — METOPROLOL TARTRATE 25 MG TAB: 25 mg | ORAL | Qty: 1

## 2020-06-05 MED FILL — ELIQUIS 5 MG TABLET: 5 mg | ORAL | Qty: 2

## 2020-06-05 NOTE — Progress Notes (Signed)
Transition of Care Plan:    RUR: 8% low  Disposition: home   Follow up appointments: PCP/specialist   DME needed: none  Transportation at Discharge: Medicaid transport   Keys or means to access home: Pt has access to home      IM Medicare Letter: to be provided   Is patient a BCPI-A Bundle:  N/a       If yes, was Bundle Letter given?: N/a    Caregiver Contact: Liana Gerold: [phone]  Discharge Caregiver contacted prior to discharge?   Pt to notify caregiver           10:50am: PCP and cardiologist  follow-up has been arranged. Roundtrip medicaid transport to arrive between 11a-2pm.     Medicare pt has received, reviewed, and signed 2nd IM letter informing them of their right to appeal the discharge.  Signed copy has been placed on pt bedside chart.      No further discharge needs at this time. Pt is cleared from CM standpoint.           9:30am: CM contacted [facility] to verify pricing on Eliquis. CM was informed that it would cost Pt $4. CM notified Pt, Pt is able to afford cost.     Pt is also needing a new PCP arranged. CM sent new PCP request to CM specialist.     CM to arrange Medicaid transport via roundtrip.         8:20am: CM acknowledged consult for pricing on eliquis. CM contacted Pt to verify Preferred pharmacy. Pt stated that he would prefer prescriptions to be sent to [facility] on [address] near [facility].     CM notified attending of Pt's preferred pharmacy. Eliquis script to be sent to verify pricing.     CM will continue to follow for discharge planning needs.     Caroll Rancher, [place], CM  [employer]  [phone]

## 2020-06-05 NOTE — Progress Notes (Signed)
Problem: Falls - Risk of  Goal: *Absence of Falls  Description: Document Schmid Fall Risk and appropriate interventions in the flowsheet.  Outcome: Progressing Towards Goal  Note: Fall Risk Interventions:  Mobility Interventions: Utilize walker, cane, or other assistive device         Medication Interventions: Teach patient to arise slowly         History of Falls Interventions: Investigate reason for fall, Room close to nurse's station         Problem: Patient Education: Go to Patient Education Activity  Goal: Patient/Family Education  Outcome: Progressing Towards Goal

## 2020-06-05 NOTE — Progress Notes (Signed)
[facility] Progress Note      Ronald Kennedy  10-22-1960  [id number]      06/05/2020  12:56 PM    Follow-up for: Acute b/l PE and RLE DVT     [x]  Chart notes since last visit reviewed     [x]  Medications reviewed for allergies and interactions    Patient complains of the following:   Feels quite well. No new complaints    Subjective:     12 point ROS negative except as in HPI and above      Objective:     Patient Vitals for the past 24 hrs:   BP Temp Pulse Resp SpO2   06/05/20 0733 119/82 97.9 ??F (36.6 ??C) (!) 58 16 94 %   06/05/20 0224 113/62 98.3 ??F (36.8 ??C) (!) 47 14 93 %   06/04/20 2321 124/80 98.6 ??F (37 ??C) (!) 50 16 94 %   06/04/20 1948 128/80 98.1 ??F (36.7 ??C) (!) 52 16 96 %   06/04/20 1518 127/85 99 ??F (37.2 ??C) (!) 52 16 96 %   06/04/20 1115 119/77 98 ??F (36.7 ??C) (!) 50 16 98 %       Physical Exam:  Gen NAD  Resp??    Available labs reviewed:  Labs:    Recent Results (from the past 24 hour(s))   PTT    Collection Time: 06/04/20 11:01 AM   Result Value Ref Range    aPTT 32.4 (H) 22.1 - 31.0 sec    aPTT, therapeutic range     58.0 - 77.0 SECS   PTT    Collection Time: 06/04/20  6:26 PM   Result Value Ref Range    aPTT 34.7 (H) 22.1 - 31.0 sec    aPTT, therapeutic range     58.0 - 77.0 SECS   PTT    Collection Time: 06/05/20  1:04 AM   Result Value Ref Range    aPTT 46.9 (H) 22.1 - 31.0 sec    aPTT, therapeutic range     58.0 - 77.0 SECS   CBC W/O DIFF    Collection Time: 06/05/20  1:04 AM   Result Value Ref Range    WBC 5.5 4.1 - 11.1 K/uL    RBC 4.14 4.10 - 5.70 M/uL    HGB 12.5 12.1 - 17.0 g/dL    HCT 06/07/20 06/07/20 - 16.0 %    MCV 92.5 80.0 - 99.0 FL    MCH 30.2 26.0 - 34.0 PG    MCHC 32.6 30.0 - 36.5 g/dL    RDW 73.7 10.6 - 26.9 %    PLATELET 182 150 - 400 K/uL    MPV 9.8 8.9 - 12.9 FL    NRBC 0.0 0 PER 100 WBC    ABSOLUTE NRBC 0.00 0.00 - 0.01 K/uL       Imaging:  CXR Results  (Last 48 hours)    None        CT Results  (Last 48 hours)               06/04/20 1818  CT ABD PELV W CONT Final result     Impression:  1.  No acute intra-abdominal pathology. No evidence of malignancy.       2.  Focal area of edema adjacent to the left posterior paraspinal musculature of   uncertain clinical significance.       3.  Other insult findings as above.       Narrative:  EXAM: CT ABD PELV W CONT       INDICATION: left psoas swelling/mass X 6 months, unprovoked PE and dvt,?   malignancy       COMPARISON: No comparisons        CONTRAST: 100 mL of Isovue-370.       TECHNIQUE:    Following the uneventful intravenous administration of contrast, thin axial   images were obtained through the abdomen and pelvis. Coronal and sagittal   reconstructions were generated. Oral contrast was not administered. CT dose   reduction was achieved through use of a standardized protocol tailored for this   examination and automatic exposure control for dose modulation.       FINDINGS:    LOWER THORAX: Linear opacities in the bilateral lung bases. Small hyperdense   foci within the airspace opacities. Probable residual thrombus in the left lower   lobe pulmonary arteries.   LIVER: Hepatic steatosis   BILIARY TREE: Gallbladder is within normal limits. CBD is not dilated.   SPLEEN: within normal limits.   PANCREAS: No mass or ductal dilatation.   ADRENALS: Unremarkable.   KIDNEYS: No mass, calculus, or hydronephrosis.   STOMACH: Unremarkable.   SMALL BOWEL: No dilatation or wall thickening.   COLON: Colonic diverticulosis. No evidence of diverticulitis.   APPENDIX: Unremarkable   PERITONEUM: No ascites or pneumoperitoneum.   RETROPERITONEUM: No lymphadenopathy or aortic aneurysm.   REPRODUCTIVE ORGANS: Mildly enlarged prostate   URINARY BLADDER: No mass or calculus.   BONES: No destructive bone lesion.   ABDOMINAL WALL: Severe osteoarthritis of the bilateral hips. Degenerative   changes in the lumbar spine.   ADDITIONAL COMMENTS: Small amount of edema adjacent to the left posterior   paraspinal musculature.                   Assessment/Plan:    *)Unprovoked symptomatic large volume b/l Pulmonary embolism with RLE proximal, main and distal  femoral and popliteal vein DVT  --Etiology: Unclear yet, unprovoked, except minor provocation of diarrheal illness with dehydration preceding the episode, at his age , keep occult malignancy in mind and may consider a hypercoag w/u as an outpatient, if no malignancy is found.   --Duration of anticoagulation:Usually for provoked events-3-6 months , with option of extended low dose anticoagulation for 1 year, but in this gentleman with otherwise unprovoked large volume PE, can consider indefinite anticoagulation .  -Tolerating anticoagulation well, feels better, Has been switched to Eliquis  -will set him up for f/u with me in 3-4 weeks  ??  unprovoked VTE  --CT A/P shows no sign of malignancy, CT chest shows no sign of malignancy  ??  *)Age appropriate cancer screening  --last Colonoscopy - 4-5 years ago, last prostate exam and PSA few years back  ??

## 2020-06-05 NOTE — Progress Notes (Signed)
Problem: Falls - Risk of  Goal: *Absence of Falls  Description: Document Ronald Kennedy Fall Risk and appropriate interventions in the flowsheet.  Outcome: Resolved/Met  Note: Fall Risk Interventions:  Mobility Interventions: Bed/chair exit alarm, Patient to call before getting OOB         Medication Interventions: Teach patient to arise slowly         History of Falls Interventions: Bed/chair exit alarm         Problem: Patient Education: Go to Patient Education Activity  Goal: Patient/Family Education  Outcome: Resolved/Met

## 2020-06-05 NOTE — Discharge Summary (Signed)
Discharge Summary by Minette Brine, MD at 06/05/20 7353                Author: Minette Brine, MD  Service: Internal Medicine  Author Type: Physician       Filed: 06/05/20 1700  Date of Service: 06/05/20 0957  Status: Signed          Editor: Minette Brine, MD (Physician)                                       Hospitalist Discharge Summary        Patient ID:   Ronald Kennedy   [id number]   59 y.o.   06/21/1961   06/01/2020      PCP on record: Other, Phys, MD      Admit date: 06/01/2020   Discharge date and time: 06/05/2020      DISCHARGE DIAGNOSIS:   Acute respiratory failure with hypoxia    Large b/l PE unprovoked   RLE DVT    NSTEMI, POA ruled out   Syncopal episode   Anginal chest pain, POA   Exertional dyspnea, POA   Tobacco smoking, POA   Hypoxia upon ambulation per RN         CONSULTATIONS:   IP CONSULT TO HOSPITALIST   IP CONSULT TO CARDIOLOGY   IP CONSULT TO HEMATOLOGY      Excerpted HPI from H&P of Avel Sensor, MD:   CHIEF COMPLAINT: Chest pain   ??   HISTORY OF PRESENT ILLNESS:      ??   The patient is 59 years old man with no significant past medical history presented emergency department due to episodes of chest pain and exertional dyspnea.  He reported that he had 2 episodes of chest pain in the last 24 hours with his chest pain is  sharp, substernal, nonradiating, substernal, aggravated by physical activity and relieved by rest.  He also reported that he gets short of breath every time he moves around when he gets some rest his shortness of breath goes away.  Patient smoke cigar,  does not drink alcohol or use also drugs.   ??   ??   Emergency department, EKG with no acute ischemic changes however his troponin was 0.06.  He was chest pain-free.   ??   ??   We were asked to admit for work up and evaluation of the above problems.    ??   No past medical history on file.       ______________________________________________________________________   DISCHARGE SUMMARY/HOSPITAL COURSE:   for full details see  H&P, daily progress notes, labs, consult notes.       Acute respiratory failure with hypoxia    Large b/l PE unprovoked   RLE DVT    D-dimer was elevated, patient hypoxic upon ambulation   CT of the chest showed large bilateral PE, started on heparin drip   2D echo was negative for RV strain   Vital signs are stable and patient is not hypoxic upon at rest   Check lower extremity Doppler and consult heme-onc   09/19:   Venous doppler showed :   Acute thrombus present in the right femoral vein. Acute thrombus present in the right proximal femoral vein. Acute thrombus present in the right mid femoral vein. Acute thrombus present  in the right distal femoral vein. Acute thrombus present in the  right popliteal vein.    Appreciate heme-onc input, CT abdomen and pelvis which did not show any underlying malignancy   Patient was started on Eliquis on discharge day, heparin drip stopped   He was instructed to follow-up with PCP and heme-onc   NSTEMI, POA ruled out   Syncopal episode   Anginal chest pain, POA   Exertional dyspnea, POA   Tobacco smoking, POA   Hypoxia upon ambulation per RN   -Presented with anginal chest pain in nature and exertional dyspnea. ??Reported diaphoresis while experiencing chest pain   -EKG with no acute ischemic changes   -Initial troponin 0 0.06, full troponin every 6 hours   -Chest pain-free   -TIMI score of 2??(positive cardiac enzymes, 2 episodes of chest pain in the last 24 hours)   -Active smoker is a risk factor for coronary artery disease. ??Counseling was provided about tobacco cessation   ontinue with aspirin   Had a negative stress test   Plan for event monitor as outpatient, follow-up with cardiology            _______________________________________________________________________   Patient seen and examined by me on discharge day.   Pertinent Findings:   Gen:    Not in distress   Chest: Clear lungs   CVS:   Regular rhythm.  No edema   Abd:  Soft, not distended, not tender   Neuro:   Alert, oriented x4   _______________________________________________________________________   DISCHARGE MEDICATIONS:      Current Discharge Medication List              START taking these medications          Details        !! apixaban (ELIQUIS) 5 mg tablet  Take 2 Tablets by mouth two (2) times a day for 7 days.   Qty: 28 Tablet, Refills:  0   Start date: 06/05/2020, End date:  06/12/2020               aspirin 81 mg chewable tablet  Take 1 Tablet by mouth daily for 90 days.   Qty: 30 Tablet, Refills:  2   Start date: 06/06/2020, End date:  09/04/2020               atorvastatin (LIPITOR) 40 mg tablet  Take 1 Tablet by mouth nightly for 90 days.   Qty: 30 Tablet, Refills:  2   Start date: 06/05/2020, End date:  09/03/2020               !! apixaban (ELIQUIS) 5 mg tablet  Take 1 Tablet by mouth two (2) times a day for 180 days. Starting from 09/27   Qty: 60 Tablet, Refills:  5   Start date: 06/12/2020, End date:  12/09/2020               metoprolol tartrate (LOPRESSOR) 25 mg tablet  Take 1 Tablet by mouth every twelve (12) hours for 90 days.   Qty: 60 Tablet, Refills:  2   Start date: 06/05/2020, End date:  09/03/2020               !! - Potential duplicate medications found. Please discuss with provider.                     Patient Follow Up Instructions:    Activity: Activity as tolerated   Diet: Cardiac Diet   Wound Care: None needed  Follow-up Information               Follow up With  Specialties  Details  Why  Contact Info              Jason Coop, MD  Cardiology, Cardio Vascular Surgery, Internal Medicine  Go on 06/30/2020  at 1030 for cardiology follow up   [address]   [place] [address]   [phone]                 [facility]  Cardiology  Call  Call Monday to set up a time this week to pick up an event monitor   [address]   [place] [address]   [phone]       Erenest Rasher, MD  Internal Medicine  On 06/08/2020  For Virtual new patient  appointment at 3:10PM  [address]   [address]   [place] [address]   [phone]          Other, Phys, MD        Patient can only remember the practice name and not the physician                 Lollie Marrow, MD  Hematology and Oncology, Hematology, Oncology  Schedule an appointment as soon as possible for a visit in 1 month    [address]   [address]   [place] [address]   [phone]                ________________________________________________________________      Risk of deterioration: Moderate      Condition at Discharge:  Stable   __________________________________________________________________      Disposition   Home with family, no needs      ____________________________________________________________________      Code Status: Full Code   ___________________________________________________________________         Total time in minutes spent coordinating this discharge (includes going over instructions, follow-up, prescriptions, and preparing report for sign off to her PCP) :  >30 minutes      Signed:   Minette Brine, MD

## 2020-06-05 NOTE — Progress Notes (Signed)
Discharge instructions discussed with patient. All questions answered. Patient verbalized understanding. Cath site CDI, no hematoma. Care instructions and restrictions reviewed. Patient instructed to make follow up appts per discharge instructions. Patient signed discharge instructions after reviewing them and duplicate copy placed in chart. Belongings gathered and accounted for. Telemetry monitor and IV removed. Tolerating ambulation in room and hallway. Denies CP, SOB, or dizziness.     Escorted out via w/c, discharged home via [facility].

## 2020-06-06 LAB — GAMMOPATHY EVAL, SPEP/IFE, IG QT/FLC
A/G RATIO, 149531: 1 (ref 0.7–1.7)
A/G ratio: 1 (ref 0.7–1.7)
ALBUMIN, 149520: 3.3 g/dL (ref 2.9–4.4)
ALPHA-2-GLOBULIN: 0.7 g/dL (ref 0.4–1.0)
Albumin: 3.3 g/dL (ref 2.9–4.4)
Alpha-1-Globulin: 0.3 g/dL (ref 0.0–0.4)
Alpha-1-globulin: 0.3 g/dL (ref 0.0–0.4)
Alpha-2-Globulin: 0.7 g/dL (ref 0.4–1.0)
BETA GLOBULIN, 149523: 1 g/dL (ref 0.7–1.3)
Beta Globulin: 1 g/dL (ref 0.7–1.3)
FREE KAPPA LT CHAINS, SERUM: 33.6 mg/L — ABNORMAL HIGH (ref 3.3–19.4)
FREE LAMBDA LT CHAINS, SERUM: 22.4 mg/L (ref 5.7–26.3)
Free Kappa Lt Chains, serum: 33.6 mg/L — ABNORMAL HIGH (ref 3.3–19.4)
Free Lambda Lt Chains, serum: 22.4 mg/L (ref 5.7–26.3)
GAMMA GLOBULIN, 149524: 1.3 g/dL (ref 0.4–1.8)
GLOBULIN, TOTAL: 3.4 g/dL (ref 2.2–3.9)
Gamma Globulin: 1.3 g/dL (ref 0.4–1.8)
Globulin, total: 3.4 g/dL (ref 2.2–3.9)
Immunoglobulin A, Qt.: 296 mg/dL (ref 61–437)
Immunoglobulin A, Qt.: 296 mg/dL (ref 61–437)
Immunoglobulin G, Qt.: 1276 mg/dL (ref 603–1613)
Immunoglobulin G, Quantitative: 1276 mg/dL (ref 603–1613)
Immunoglobulin M, Qt.: 73 mg/dL (ref 20–172)
Immunoglobulin M, Quantitative: 73 mg/dL (ref 20–172)
KAPPA/LAMBDA RATIO, SERUM: 1.5 (ref 0.26–1.65)
Kappa/Lambda ratio, serum: 1.5 (ref 0.26–1.65)
Protein, total: 6.7 g/dL (ref 6.0–8.5)
Total Protein: 6.7 g/dL (ref 6.0–8.5)

## 2020-06-06 LAB — INTRINSIC FACTOR AB: Intrinsic factor Abs: 14.6 AU/mL — ABNORMAL HIGH (ref 0.0–1.1)

## 2020-06-06 LAB — PARIETAL CELL AB: Antiparietal Cell Ab: 42 Units — ABNORMAL HIGH (ref 0.0–20.0)

## 2020-06-06 NOTE — Progress Notes (Signed)
 Care Transitions Initial Call    Call within 2 business days of discharge: Yes     Patient: Ronald Kennedy Patient DOB: Jun 06, 1961 MRN: [id number]    Last Discharge [facility]       Complaint Diagnosis Description Type Department Provider    06/01/20 Palpitations Acute respiratory failure with hypoxia (HCC) ... ED to Hosp-Admission (Discharged) (ADMIT) [id number] Wadie Salley POUR, MD; Cyrena Shove,...          Was this an external facility discharge? No     Challenges to be reviewed by the provider   Additional needs identified to be addressed with provider:     Pick up cardiac heart monitor       Method of communication with provider : none    Discussed COVID-19 related testing which was available at this time.   Test results were negative.   Patient informed of results, if available? yes     Advance Care Planning:   Does patient have an Advance Directive:  currently not on file; education provided     Inpatient Readmission Risk score: Unplanned Readmit Risk Score: 9    Was this a readmission? no   Patient stated reason for the admission: palpitations    Patients top risk factors for readmission: lack of knowledge about disease, medical condition-blood clots, PCP relationship, support system and transportation   Interventions to address risk factors: Scheduled appointment with PCP-9/23, Scheduled appointment with Specialist-10/15, Obtained and reviewed discharge summary and/or continuity of care documents, Education of patient/family/caregiver/guardian to support self-management-blood clots and Establishment or re-establishment of referrals-new patient appt     Provided patient with [employer] phone number to assist with transportation needs.    Care Transition Nurse (CTN) contacted the patient by telephone to perform post hospital discharge assessment. Verified name and DOB with patient as identifiers. Provided introduction to self, and explanation of the CTN role. Reports doing well. Denies chest pain, sob,  fever. Tolerating PO. No bowel / bladder concerns. Reviewed Eliquis    CTN reviewed discharge instructions, medical action plan and red flags with patient who verbalized understanding. Were discharge instructions available to patient? yes. Reviewed appropriate site of care based on symptoms and resources available to patient including: PCP and Specialist. Patient given an opportunity to ask questions and does not have any further questions or concerns at this time. The patient agrees to contact the PCP office for questions related to their healthcare.     Medication reconciliation was performed with patient, who verbalizes understanding of administration of home medications.     Referral to Pharm D needed: no     Home Health/Outpatient orders at discharge: none    Durable Medical Equipment ordered at discharge: None      Covid Risk Education    Educated patient about risk for severe COVID-19 due to risk factors according to CDC guidelines. CTN reviewed discharge instructions, medical action plan and red flag symptoms with the patient who verbalized understanding. Discussed COVID vaccination status: yes. Reports he has no desire to receive COVID vaccine. Discussed exposure protocols and quarantine with CDC Guidelines. Patient was given an opportunity to verbalize any questions and concerns and agrees to contact CTN or health care provider for questions related to their healthcare.    Was patient discharged with a pulse oximeter? no.     Discussed follow-up appointments. If no appointment was previously scheduled, appointment scheduling offered: no. Is follow up appointment scheduled within 7 days of discharge? yes.   [facility] follow up  appointment(s): No future appointments.  Non-[facility] follow up appointment(s): 9/2    Plan for follow-up call in 5-7 days based on severity of symptoms and risk factors.  Plan for next call: symptom management-blood clots, self management-taking Eliquis as prescribed, follow up  appointment-PCP/cards and medication management-compliance with Eliquis  CTN provided contact information for future needs.    Goals Addressed                 This Visit's Progress    . Attend follow up appointments on schedule          06/06/20  . Will attend [facility] follow up appt with new PCP scheduled 9/23  . Will pick up cardiac heart monitor by 9/24  . Will attend follow up appt with cards scheduled 10/15      . Takes all medications as ordered          06/06/20  . Reviewed Eliquis-works by thinning blood, discussed risk for bleeding  . Pt will state the name, dose, frequency, and one side effect of Eliquis by next week

## 2020-06-06 NOTE — ACP (Advance Care Planning) (Signed)
Advance Care Planning:   Does patient have an Advance Directive:  currently not on file; education provided

## 2020-06-08 ENCOUNTER — Encounter: Attending: Internal Medicine | Primary: Family Medicine

## 2020-06-11 LAB — ONCOTYPE DX DCIS: METHYLMALONIC ACID: 184 nmol/L (ref 0–378)

## 2020-06-11 LAB — METHYLMALONIC ACID: Methylmalonic acid: 184 nmol/L (ref 0–378)

## 2020-06-14 NOTE — Progress Notes (Signed)
Contacted the patient for a follow up, LMTCB

## 2020-06-22 NOTE — Progress Notes (Signed)
Contacted the patient for a follow up, LMTCB.

## 2020-06-30 ENCOUNTER — Encounter: Attending: Cardiovascular Disease | Primary: Family Medicine

## 2021-02-22 ENCOUNTER — Inpatient Hospital Stay
Admit: 2021-02-22 | Discharge: 2021-02-23 | Disposition: A | Discharge status: Home / Self Care | Payer: MEDICARE | Attending: Emergency Medicine

## 2021-02-22 ENCOUNTER — Emergency Department: Admit: 2021-02-22 | Payer: MEDICARE | Primary: Family Medicine

## 2021-02-22 DIAGNOSIS — J0100 Acute maxillary sinusitis, unspecified: Secondary | ICD-10-CM

## 2021-02-22 LAB — COMPREHENSIVE METABOLIC PANEL
ALT: 16 U/L (ref 12–78)
AST: 18 U/L (ref 15–37)
Albumin/Globulin Ratio: 0.8 — ABNORMAL LOW (ref 1.1–2.2)
Albumin: 3.7 g/dL (ref 3.5–5.0)
Alkaline Phosphatase: 144 U/L — ABNORMAL HIGH (ref 45–117)
Anion Gap: 11 mmol/L (ref 5–15)
BUN: 12 MG/DL (ref 6–20)
Bun/Cre Ratio: 10 — ABNORMAL LOW (ref 12–20)
CO2: 23 mmol/L (ref 21–32)
Calcium: 9.6 MG/DL (ref 8.5–10.1)
Chloride: 98 mmol/L (ref 97–108)
Creatinine: 1.15 MG/DL (ref 0.70–1.30)
EGFR IF NonAfrican American: >60 mL/min/{1.73_m2} (ref >60)
GFR African American: >60 mL/min/{1.73_m2} (ref >60)
Globulin: 4.6 g/dL — ABNORMAL HIGH (ref 2.0–4.0)
Glucose: 76 mg/dL (ref 65–100)
Potassium: 3.9 mmol/L (ref 3.5–5.1)
Sodium: 132 mmol/L — ABNORMAL LOW (ref 136–145)
Total Bilirubin: 1.4 MG/DL — ABNORMAL HIGH (ref 0.2–1.0)
Total Protein: 8.3 g/dL — ABNORMAL HIGH (ref 6.4–8.2)

## 2021-02-22 LAB — CBC WITH AUTO DIFFERENTIAL
Basophils %: 1 % (ref 0–1)
Basophils Absolute: 0 10*3/uL (ref 0.0–0.1)
Eosinophils %: 0 % (ref 0–7)
Eosinophils Absolute: 0 10*3/uL (ref 0.0–0.4)
Granulocyte Absolute Count: 0 10*3/uL (ref 0.00–0.04)
Hematocrit: 45.9 % (ref 36.6–50.3)
Hemoglobin: 15.4 g/dL (ref 12.1–17.0)
Immature Granulocytes: 1 % — ABNORMAL HIGH (ref 0.0–0.5)
Lymphocytes %: 19 % (ref 12–49)
Lymphocytes Absolute: 1.1 10*3/uL (ref 0.8–3.5)
MCH: 30.6 PG (ref 26.0–34.0)
MCHC: 33.6 g/dL (ref 30.0–36.5)
MCV: 91.1 FL (ref 80.0–99.0)
MPV: 9.4 FL (ref 8.9–12.9)
Monocytes %: 10 % (ref 5–13)
Monocytes Absolute: 0.6 10*3/uL (ref 0.0–1.0)
NRBC Absolute: 0 10*3/uL (ref 0.00–0.01)
Neutrophils %: 69 % (ref 32–75)
Neutrophils Absolute: 4.1 10*3/uL (ref 1.8–8.0)
Nucleated RBCs: 0 PER 100 WBC
Platelets: 258 10*3/uL (ref 150–400)
RBC: 5.04 M/uL (ref 4.10–5.70)
RDW: 13.4 % (ref 11.5–14.5)
WBC: 5.9 10*3/uL (ref 4.1–11.1)

## 2021-02-22 LAB — TROPONIN, HIGH SENSITIVITY
Troponin, High Sensitivity: 7 ng/L (ref 0–76)
Troponin, High Sensitivity: 7 ng/L (ref 0–76)

## 2021-02-22 LAB — POCT GLUCOSE: POC Glucose: 76 mg/dL (ref 65–117)

## 2021-02-22 LAB — EKG 12-LEAD
Atrial Rate: 88 {beats}/min
Diagnosis: NORMAL
P Axis: 78 degrees
P-R Interval: 142 ms
Q-T Interval: 368 ms
QRS Duration: 88 ms
QTc Calculation (Bazett): 445 ms
R Axis: 74 degrees
T Axis: 62 degrees
Ventricular Rate: 88 {beats}/min

## 2021-02-22 LAB — D-DIMER, QUANTITATIVE: D-Dimer, Quant: 0.92 mg/L FEU — ABNORMAL HIGH (ref 0.00–0.65)

## 2021-02-22 LAB — CBC WITH AUTOMATED DIFF
ABS. BASOPHILS: 0 10*3/uL (ref 0.0–0.1)
ABS. EOSINOPHILS: 0 10*3/uL (ref 0.0–0.4)
ABS. IMM. GRANS.: 0 10*3/uL (ref 0.00–0.04)
ABS. LYMPHOCYTES: 1.1 10*3/uL (ref 0.8–3.5)
ABS. MONOCYTES: 0.6 10*3/uL (ref 0.0–1.0)
ABS. NEUTROPHILS: 4.1 10*3/uL (ref 1.8–8.0)
ABSOLUTE NRBC: 0 10*3/uL (ref 0.00–0.01)
BASOPHILS: 1 % (ref 0–1)
EOSINOPHILS: 0 % (ref 0–7)
HCT: 45.9 % (ref 36.6–50.3)
HGB: 15.4 g/dL (ref 12.1–17.0)
IMMATURE GRANULOCYTES: 1 % — ABNORMAL HIGH (ref 0.0–0.5)
LYMPHOCYTES: 19 % (ref 12–49)
MCH: 30.6 pg (ref 26.0–34.0)
MCHC: 33.6 g/dL (ref 30.0–36.5)
MCV: 91.1 FL (ref 80.0–99.0)
MONOCYTES: 10 % (ref 5–13)
MPV: 9.4 FL (ref 8.9–12.9)
NEUTROPHILS: 69 % (ref 32–75)
NRBC: 0 /100{WBCs}
PLATELET: 258 10*3/uL (ref 150–400)
RBC: 5.04 M/uL (ref 4.10–5.70)
RDW: 13.4 % (ref 11.5–14.5)
WBC: 5.9 10*3/uL (ref 4.1–11.1)

## 2021-02-22 LAB — METABOLIC PANEL, COMPREHENSIVE
A-G Ratio: 0.8 — ABNORMAL LOW (ref 1.1–2.2)
ALT (SGPT): 16 U/L (ref 12–78)
AST (SGOT): 18 U/L (ref 15–37)
Albumin: 3.7 g/dL (ref 3.5–5.0)
Alk. phosphatase: 144 U/L — ABNORMAL HIGH (ref 45–117)
Anion gap: 11 mmol/L (ref 5–15)
BUN/Creatinine ratio: 10 — ABNORMAL LOW (ref 12–20)
BUN: 12 mg/dL (ref 6–20)
Bilirubin, total: 1.4 MG/DL — ABNORMAL HIGH (ref 0.2–1.0)
CO2: 23 mmol/L (ref 21–32)
Calcium: 9.6 mg/dL (ref 8.5–10.1)
Chloride: 98 mmol/L (ref 97–108)
Creatinine: 1.15 mg/dL (ref 0.70–1.30)
GFR est AA: >60 mL/min/{1.73_m2} (ref >60)
GFR est non-AA: >60 mL/min/{1.73_m2} (ref >60)
Globulin: 4.6 g/dL — ABNORMAL HIGH (ref 2.0–4.0)
Glucose: 76 mg/dL (ref 65–100)
Potassium: 3.9 mmol/L (ref 3.5–5.1)
Protein, total: 8.3 g/dL — ABNORMAL HIGH (ref 6.4–8.2)
Sodium: 132 mmol/L — ABNORMAL LOW (ref 136–145)

## 2021-02-22 LAB — EKG, 12 LEAD, INITIAL
Atrial Rate: 88 {beats}/min
Calculated P Axis: 78 degrees
Calculated R Axis: 74 degrees
Calculated T Axis: 62 degrees
Diagnosis: NORMAL
P-R Interval: 142 ms
Q-T Interval: 368 ms
QRS Duration: 88 ms
QTC Calculation (Bezet): 445 ms
Ventricular Rate: 88 {beats}/min

## 2021-02-22 LAB — SAMPLES BEING HELD

## 2021-02-22 LAB — TROPONIN-HIGH SENSITIVITY
Troponin-High Sensitivity: 7 ng/L (ref 0–76)
Troponin-High Sensitivity: 7 ng/L (ref 0–76)

## 2021-02-22 LAB — GLUCOSE, POC: Glucose (POC): 76 mg/dL (ref 65–117)

## 2021-02-22 LAB — D DIMER: D-dimer: 0.92 mg/L FEU — ABNORMAL HIGH (ref 0.00–0.65)

## 2021-02-22 MED ORDER — IOPAMIDOL 76 % IV SOLN
76 % | Freq: Once | INTRAVENOUS | Status: AC
Start: 2021-02-22 — End: 2021-02-22
  Administered 2021-02-22: 20:00:00 via INTRAVENOUS

## 2021-02-22 MED ORDER — AMOXICILLIN CLAVULANATE 875 MG-125 MG TAB
875-125 mg | ORAL_TABLET | Freq: Two times a day (BID) | ORAL | 0 refills | Status: AC
Start: 2021-02-22 — End: 2021-03-04

## 2021-02-22 MED ORDER — SODIUM CHLORIDE 0.9% BOLUS IV
0.9 % | Freq: Once | INTRAVENOUS | Status: AC
Start: 2021-02-22 — End: 2021-02-22
  Administered 2021-02-22: 19:00:00 via INTRAVENOUS

## 2021-02-22 MED ORDER — AMOXICILLIN CLAVULANATE 875 MG-125 MG TAB
875-125 mg | ORAL | Status: AC
Start: 2021-02-22 — End: 2021-02-22
  Administered 2021-02-22: via ORAL

## 2021-02-22 MED FILL — ISOVUE-370  76 % INTRAVENOUS SOLUTION: 370 mg iodine /mL (76 %) | INTRAVENOUS | Qty: 100

## 2021-02-22 MED FILL — AMOXICILLIN CLAVULANATE 875 MG-125 MG TAB: 875-125 mg | ORAL | Qty: 1

## 2021-02-22 MED FILL — SODIUM CHLORIDE 0.9 % IV: INTRAVENOUS | Qty: 1000

## 2021-02-22 NOTE — ED Provider Notes (Signed)
Ronald Kennedy is a 60 yo M with h/o PE who presents to the ED with lightheadedness and shortness of breath.  He states his symptoms started about 1 hour ago when he was at a bus stop.  He states that his symptoms are now resolved but that he felt very similar to 1 year ago when he had PE.  He states he had been prescribed a blood thinner which he took through last January but has not been on it since.  He was supposed  To follow up to be rechecked but did not because he could not afford transportation. He also notes that he has not eaten in 2 days because he has "been dealing with some things" and has not felt like eating.            No past medical history on file.    Past Surgical History:   Procedure Laterality Date   ??? HX ORTHOPAEDIC      left knee for torn ligament         No family history on file.    Social History     Socioeconomic History   ??? Marital status: DIVORCED     Spouse name: Not on file   ??? Number of children: Not on file   ??? Years of education: Not on file   ??? Highest education level: Not on file   Occupational History   ??? Not on file   Tobacco Use   ??? Smoking status: Never Smoker   ??? Smokeless tobacco: Never Used   Substance and Sexual Activity   ??? Alcohol use: Yes     Comment: occ   ??? Drug use: Not on file   ??? Sexual activity: Not on file   Other Topics Concern   ??? Not on file   Social History Narrative   ??? Not on file     Social Determinants of Health     Financial Resource Strain:    ??? Difficulty of Paying Living Expenses: Not on file   Food Insecurity:    ??? Worried About Running Out of Food in the Last Year: Not on file   ??? Ran Out of Food in the Last Year: Not on file   Transportation Needs:    ??? Lack of Transportation (Medical): Not on file   ??? Lack of Transportation (Non-Medical): Not on file   Physical Activity:    ??? Days of Exercise per Week: Not on file   ??? Minutes of Exercise per Session: Not on file   Stress:    ??? Feeling of Stress : Not on file   Social Connections:    ??? Frequency of  Communication with Friends and Family: Not on file   ??? Frequency of Social Gatherings with Friends and Family: Not on file   ??? Attends Religious Services: Not on file   ??? Active Member of Clubs or Organizations: Not on file   ??? Attends Banker Meetings: Not on file   ??? Marital Status: Not on file   Intimate Partner Violence:    ??? Fear of Current or Ex-Partner: Not on file   ??? Emotionally Abused: Not on file   ??? Physically Abused: Not on file   ??? Sexually Abused: Not on file   Housing Stability:    ??? Unable to Pay for Housing in the Last Year: Not on file   ??? Number of Places Lived in the Last Year: Not on file   ???  Unstable Housing in the Last Year: Not on file         ALLERGIES: Patient has no known allergies.    Review of Systems   Constitutional: Negative for fever.   HENT: Negative for sore throat.    Eyes: Negative for visual disturbance.   Respiratory: Positive for shortness of breath. Negative for cough.    Cardiovascular: Negative for chest pain.   Gastrointestinal: Negative for abdominal pain.   Genitourinary: Negative for dysuria.   Musculoskeletal: Negative for back pain.   Skin: Negative for rash.   Neurological: Positive for light-headedness. Negative for headaches.       Vitals:    02/22/21 1426 02/22/21 1502 02/22/21 1745   BP: 119/80 111/84 (!) 138/96   Pulse: 88 81 70   Resp: 18 13 16    Temp: 97.2 ??F (36.2 ??C) 98.1 ??F (36.7 ??C)    SpO2: 97% 96%             Physical Exam  Vitals and nursing note reviewed.   Constitutional:       General: He is not in acute distress.     Appearance: He is well-developed.   HENT:      Head: Normocephalic and atraumatic.   Eyes:      Conjunctiva/sclera: Conjunctivae normal.   Neck:      Trachea: Phonation normal.   Cardiovascular:      Rate and Rhythm: Normal rate and regular rhythm.      Pulses: Normal pulses.   Pulmonary:      Effort: Pulmonary effort is normal. No respiratory distress.      Breath sounds: No wheezing or rales.   Abdominal:      General:  There is no distension.   Musculoskeletal:         General: No tenderness. Normal range of motion.      Cervical back: Normal range of motion.   Skin:     General: Skin is warm and dry.   Neurological:      Mental Status: He is alert. He is not disoriented.      Motor: No abnormal muscle tone.     ED EKG interpretation:  Rhythm: normal sinus rhythm; and regular . Rate (approx.): 88; Axis: normal; P wave: normal; QRS interval: normal ; ST/T wave: normal; Other findings: normal. This EKG was interpreted by , MD,ED Provider.     MDM         3:59 PM  Labs reviewed and ddimer elevated.  CTA ordered.  Trop normal.  Repeat ordered for 5pm.  Patient remains in no distress.     6:31 PM  CTA negative for PE.  Repeat trop normal.  PAtient feeling better after IVNS.  Will ambulate.  Anticipate discharge home    7:49 PM  CT head negitve for intracranial process but significant for right maxillary sinusitis.  Will treat with augmentin.  Initial dose given in the ED.    Procedures

## 2021-02-22 NOTE — ED Notes (Signed)
Ambulated pt and he stated he is still dizzy however pt able to ambulate well with cane.  Had pt close his eyes and stand still he was a little unsteady.

## 2021-02-22 NOTE — ED Triage Notes (Signed)
Formatting of this note might be different from the original.  Patient arrived via EMS from the bus stop where he reports dizziness. Denies chest pain and shortness of breath. He states he has not eaten in 2 days.  BS 68  Electronically signed by Ceasar Lund, RN at 02/22/2021  2:26 PM EDT

## 2021-02-22 NOTE — ED Notes (Signed)
Formatting of this note might be different from the original.  Bedside shift change report given to Casey/Tiffany (oncoming nurse) by Dorene Grebe (offgoing nurse). Report included the following information SBAR, Kardex, ED Summary and MAR.     Electronically signed by Cornelia Copa, RN at 02/22/2021  7:45 PM EDT

## 2021-02-22 NOTE — ED Notes (Signed)
Patient arrived via EMS from the bus stop where he reports dizziness. Denies chest pain and shortness of breath. He states he has not eaten in 2 days.  BS 68

## 2021-02-22 NOTE — ED Notes (Signed)
Bedside shift change report given to Casey/Tiffany (oncoming nurse) by Natalie (offgoing nurse). Report included the following information SBAR, Kardex, ED Summary and MAR.

## 2021-02-22 NOTE — ED Notes (Signed)
Formatting of this note might be different from the original.  Ambulated pt and he stated he is still dizzy however pt able to ambulate well with cane.  Had pt close his eyes and stand still he was a little unsteady.  Electronically signed by Cornelia Copa, RN at 02/22/2021  6:43 PM EDT
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
## 2023-05-04 ENCOUNTER — Inpatient Hospital Stay: Admit: 2023-05-04 | Discharge: 2023-05-05 | Disposition: A | Discharge status: Home / Self Care | Payer: MEDICARE

## 2023-05-04 ENCOUNTER — Emergency Department: Admit: 2023-05-04 | Payer: MEDICARE | Primary: Family Medicine

## 2023-05-04 DIAGNOSIS — M5442 Lumbago with sciatica, left side: Secondary | ICD-10-CM

## 2023-05-04 DIAGNOSIS — W19XXXA Unspecified fall, initial encounter: Secondary | ICD-10-CM

## 2023-05-04 MED ORDER — LIDOCAINE 4 % EX PTCH
4 | CUTANEOUS | Status: DC
Start: 2023-05-04 — End: 2023-05-04
  Administered 2023-05-04: 21:00:00 1 via TRANSDERMAL

## 2023-05-04 MED ORDER — PREDNISONE 20 MG PO TABS
20 | Freq: Once | ORAL | Status: AC
Start: 2023-05-04 — End: 2023-05-04
  Administered 2023-05-04: 21:00:00 60 mg via ORAL

## 2023-05-04 MED ORDER — KETOROLAC TROMETHAMINE 30 MG/ML IJ SOLN
30 | INTRAMUSCULAR | Status: AC
Start: 2023-05-04 — End: 2023-05-04
  Administered 2023-05-04: 21:00:00 30 mg via INTRAMUSCULAR

## 2023-05-04 MED ORDER — PREDNISONE 10 MG (21) PO TBPK
10 | ORAL | 0 refills | Status: AC
Start: 2023-05-04 — End: ?

## 2023-05-04 MED ORDER — DICLOFENAC SODIUM 1 % EX GEL
1 | Freq: Four times a day (QID) | CUTANEOUS | 0 refills | Status: AC | PRN
Start: 2023-05-04 — End: ?

## 2023-05-04 MED ORDER — LIDOCAINE 4 % EX PTCH
4 | MEDICATED_PATCH | Freq: Every day | CUTANEOUS | 0 refills | Status: AC
Start: 2023-05-04 — End: 2023-05-19

## 2023-05-04 MED FILL — SALONPAS PAIN RELIEVING 4 % EX PTCH: 4 % | CUTANEOUS | Qty: 1

## 2023-05-04 MED FILL — PREDNISONE 20 MG PO TABS: 20 MG | ORAL | Qty: 3

## 2023-05-04 MED FILL — KETOROLAC TROMETHAMINE 30 MG/ML IJ SOLN: 30 MG/ML | INTRAMUSCULAR | Qty: 1
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
## 2023-05-04 NOTE — Discharge Instructions (Addendum)
Lumbar spine:  No acute fracture or subluxation. Moderate multilevel degenerative disc disease    Pelvis and left hip:  Severe degenerative change of the hips with 0.5 cm of uncovering of the left  femoral head.

## 2023-05-04 NOTE — ED Notes (Signed)
Ride booked by previous shift.

## 2023-08-09 ENCOUNTER — Inpatient Hospital Stay
Admit: 2023-08-09 | Discharge: 2023-08-09 | Disposition: A | Discharge status: Home / Self Care | Payer: MEDICARE | Attending: Student in an Organized Health Care Education/Training Program

## 2023-08-09 DIAGNOSIS — S39012A Strain of muscle, fascia and tendon of lower back, initial encounter: Secondary | ICD-10-CM

## 2023-08-09 MED ORDER — KETOROLAC TROMETHAMINE 30 MG/ML IJ SOLN
30 | Freq: Once | INTRAMUSCULAR | Status: DC
Start: 2023-08-09 — End: 2023-08-09

## 2023-08-09 MED ORDER — DIAZEPAM 5 MG PO TABS
5 | Freq: Once | ORAL | Status: DC
Start: 2023-08-09 — End: 2023-08-09

## 2023-08-09 MED ORDER — DIAZEPAM 2 MG PO TABS
2 | Freq: Once | ORAL | Status: AC
Start: 2023-08-09 — End: 2023-08-09
  Administered 2023-08-09: 18:00:00 2 mg via ORAL

## 2023-08-09 MED ORDER — METHOCARBAMOL 750 MG PO TABS
750 | ORAL_TABLET | Freq: Three times a day (TID) | ORAL | 0 refills | Status: AC
Start: 2023-08-09 — End: 2023-08-19

## 2023-08-09 MED ORDER — KETOROLAC TROMETHAMINE 30 MG/ML IJ SOLN
30 | INTRAMUSCULAR | Status: AC
Start: 2023-08-09 — End: 2023-08-09
  Administered 2023-08-09: 18:00:00 30 mg via INTRAMUSCULAR

## 2023-08-09 MED ORDER — LIDOCAINE 5 % EX PTCH
5 | MEDICATED_PATCH | Freq: Every day | CUTANEOUS | 0 refills | Status: AC
Start: 2023-08-09 — End: 2023-08-19

## 2023-08-09 MED ORDER — LIDOCAINE 4 % EX PTCH
4 | CUTANEOUS | Status: DC
Start: 2023-08-09 — End: 2023-08-09
  Administered 2023-08-09: 18:00:00 1 via TRANSDERMAL

## 2023-08-09 MED FILL — KETOROLAC TROMETHAMINE 30 MG/ML IJ SOLN: 30 MG/ML | INTRAMUSCULAR | Qty: 1 | Fill #0

## 2023-08-09 MED FILL — SALONPAS PAIN RELIEVING 4 % EX PTCH: 4 % | CUTANEOUS | Qty: 1 | Fill #0

## 2023-08-09 MED FILL — DIAZEPAM 2 MG PO TABS: 2 MG | ORAL | Qty: 1 | Fill #0

## 2023-08-09 NOTE — ED Notes (Signed)
 Patient OTF to CT

## 2023-08-09 NOTE — Discharge Instructions (Addendum)
 Thank You!    It was a pleasure taking care of you in our Emergency Department today. We know that when you come to our Emergency Department, you are entrusting [place] with your health, comfort, and safety. Our physicians and nurses honor that trust, and truly

## 2023-08-09 NOTE — ED Notes (Signed)
 XRAY @bedside 

## 2023-08-09 NOTE — ED Provider Notes (Signed)
 [facility]  EMERGENCY DEPARTMENT ENCOUNTER    Patient Name: Ronald Kennedy  MRN: [id number]  Birth Date: 1961/05/19  Provider: Karrie Doffing, MD  PCP: Hosie Poisson, MD  Time/Date of evaluation: 2:38 PM EST on 08/09/23    History of Presenting [place]

## 2023-12-19 ENCOUNTER — Inpatient Hospital Stay: Admit: 2023-12-19 | Discharge: 2023-12-20 | Disposition: A | Discharge status: Home / Self Care | Payer: MEDICARE

## 2023-12-19 ENCOUNTER — Emergency Department: Admit: 2023-12-19 | Payer: MEDICARE | Primary: Family Medicine

## 2023-12-19 DIAGNOSIS — S20212A Contusion of left front wall of thorax, initial encounter: Secondary | ICD-10-CM

## 2023-12-19 LAB — URINALYSIS WITH REFLEX TO CULTURE
BACTERIA, URINE: NEGATIVE /HPF
Bilirubin, Urine: NEGATIVE
Blood, Urine: NEGATIVE
Glucose, Ur: NEGATIVE mg/dL
Ketones, Urine: NEGATIVE mg/dL
Leukocyte Esterase, Urine: NEGATIVE
Nitrite, Urine: NEGATIVE
Protein, UA: NEGATIVE mg/dL
Specific Gravity, UA: <1.005
Urobilinogen, Urine: 1 EU/dL (ref 0.2–1.0)
pH, Urine: 5.5 (ref 5.0–8.0)

## 2023-12-19 LAB — CBC WITH AUTO DIFFERENTIAL
Basophils %: 0.4 % (ref 0.0–1.0)
Basophils Absolute: 0.02 10*3/uL (ref 0.00–0.10)
Eosinophils %: 0.8 % (ref 0.0–7.0)
Eosinophils Absolute: 0.04 10*3/uL (ref 0.00–0.40)
Hematocrit: 33.4 % — ABNORMAL LOW (ref 36.6–50.3)
Hemoglobin: 11.8 g/dL — ABNORMAL LOW (ref 12.1–17.0)
Immature Granulocytes %: 0.2 % (ref 0.0–0.5)
Immature Granulocytes Absolute: 0.01 10*3/uL (ref 0.00–0.04)
Lymphocytes %: 23.4 % (ref 12.0–49.0)
Lymphocytes Absolute: 1.2 10*3/uL (ref 0.80–3.50)
MCH: 31.7 pg (ref 26.0–34.0)
MCHC: 35.3 g/dL (ref 30.0–36.5)
MCV: 89.8 FL (ref 80.0–99.0)
MPV: 9.9 FL (ref 8.9–12.9)
Monocytes %: 10.5 % (ref 5.0–13.0)
Monocytes Absolute: 0.54 10*3/uL (ref 0.00–1.00)
Neutrophils %: 64.7 % (ref 32.0–75.0)
Neutrophils Absolute: 3.32 10*3/uL (ref 1.80–8.00)
Nucleated RBCs: 0 /100{WBCs}
Platelets: 173 10*3/uL (ref 150–400)
RBC: 3.72 M/uL — ABNORMAL LOW (ref 4.10–5.70)
RDW: 13.6 % (ref 11.5–14.5)
WBC: 5.1 10*3/uL (ref 4.1–11.1)
nRBC: 0 10*3/uL (ref 0.00–0.01)

## 2023-12-19 LAB — COMPREHENSIVE METABOLIC PANEL
ALT: 23 U/L (ref 12–78)
AST: 32 U/L (ref 15–37)
Albumin/Globulin Ratio: 0.9 — ABNORMAL LOW (ref 1.1–2.2)
Albumin: 2.8 g/dL — ABNORMAL LOW (ref 3.5–5.0)
Alk Phosphatase: 81 U/L (ref 45–117)
Anion Gap: 15 mmol/L — ABNORMAL HIGH (ref 2–12)
BUN/Creatinine Ratio: 12 (ref 12–20)
BUN: 9 mg/dL (ref 6–20)
CO2: 21 mmol/L (ref 21–32)
Calcium: 7.7 mg/dL — ABNORMAL LOW (ref 8.5–10.1)
Chloride: 102 mmol/L (ref 97–108)
Creatinine: 0.75 mg/dL (ref 0.70–1.30)
Est, Glom Filt Rate: >90 mL/min/{1.73_m2} (ref >60)
Globulin: 3.2 g/dL (ref 2.0–4.0)
Glucose: 97 mg/dL (ref 65–100)
Potassium: 3.7 mmol/L (ref 3.5–5.1)
Sodium: 138 mmol/L (ref 136–145)
Total Bilirubin: 0.4 mg/dL (ref 0.2–1.0)
Total Protein: 6 g/dL — ABNORMAL LOW (ref 6.4–8.2)

## 2023-12-19 LAB — LIPASE: Lipase: 24 U/L (ref 13–75)

## 2023-12-19 LAB — TROPONIN: Troponin, High Sensitivity: 11 ng/L (ref 0–76)

## 2023-12-19 LAB — BRAIN NATRIURETIC PEPTIDE: NT Pro-BNP: 25 pg/mL (ref 0–125)

## 2023-12-19 MED ORDER — IOPAMIDOL 76 % IV SOLN
76 | Freq: Once | INTRAVENOUS | Status: AC | PRN
Start: 2023-12-19 — End: 2023-12-19
  Administered 2023-12-19: 97 mL via INTRAVENOUS

## 2023-12-19 NOTE — Discharge Instructions (Signed)
 Follow-up with PCP and cardiology   Return precautions as we discussed       Thank You!    It was a pleasure taking care of you in our Emergency Department today. We know that when you come to [facility], you are entrusting [place] with your health, comfort, and safety. Our clinicians honor that trust, and truly appreciate the opportunity to care for you and your loved ones.    If you receive a survey about your Emergency Department experience today, please fill it out.  We value your feedback. Thank you.      Adline Potter PA-C    ___________________________________  I have included a copy of your lab results and/or radiologic studies from today's visit so you can have them easily available at your follow-up visit.   Recent Results (from the past 12 hours)   EKG 12 Lead    Collection Time: 12/19/23  6:21 PM   Result Value Ref Range    Ventricular Rate 78 BPM    Atrial Rate 78 BPM    P-R Interval 146 ms    QRS Duration 92 ms    Q-T Interval 380 ms    QTc Calculation (Bazett) 433 ms    P Axis 54 degrees    R Axis 32 degrees    T Axis 25 degrees    Diagnosis       Normal sinus rhythm  Normal ECG  When compared with ECG of 22-Feb-2021 14:41,  No significant change was found     CBC with Auto Differential    Collection Time: 12/19/23  6:34 PM   Result Value Ref Range    WBC 5.1 4.1 - 11.1 K/uL    RBC 3.72 (L) 4.10 - 5.70 M/uL    Hemoglobin 11.8 (L) 12.1 - 17.0 g/dL    Hematocrit 95.6 (L) 36.6 - 50.3 %    MCV 89.8 80.0 - 99.0 FL    MCH 31.7 26.0 - 34.0 PG    MCHC 35.3 30.0 - 36.5 g/dL    RDW 21.3 08.6 - 57.8 %    Platelets 173 150 - 400 K/uL    MPV 9.9 8.9 - 12.9 FL    Nucleated RBCs 0.0 0 PER 100 WBC    nRBC 0.00 0.00 - 0.01 K/uL    Neutrophils % 64.7 32.0 - 75.0 %    Lymphocytes % 23.4 12.0 - 49.0 %    Monocytes % 10.5 5.0 - 13.0 %    Eosinophils % 0.8 0.0 - 7.0 %    Basophils % 0.4 0.0 - 1.0 %    Immature Granulocytes % 0.2 0.0 - 0.5 %    Neutrophils Absolute 3.32 1.80 - 8.00 K/UL     Lymphocytes Absolute 1.20 0.80 - 3.50 K/UL    Monocytes Absolute 0.54 0.00 - 1.00 K/UL    Eosinophils Absolute 0.04 0.00 - 0.40 K/UL    Basophils Absolute 0.02 0.00 - 0.10 K/UL    Immature Granulocytes Absolute 0.01 0.00 - 0.04 K/UL    Differential Type AUTOMATED     Comprehensive Metabolic Panel    Collection Time: 12/19/23  6:34 PM   Result Value Ref Range    Sodium 138 136 - 145 mmol/L    Potassium 3.7 3.5 - 5.1 mmol/L    Chloride 102 97 - 108 mmol/L    CO2 21 21 - 32 mmol/L    Anion Gap 15 (H) 2 - 12 mmol/L  Glucose 97 65 - 100 mg/dL    BUN 9 6 - 20 MG/DL    Creatinine 9.56 2.13 - 1.30 MG/DL    BUN/Creatinine Ratio 12 12 - 20      Est, Glom Filt Rate >90 >60 ml/min/1.56m2    Calcium 7.7 (L) 8.5 - 10.1 MG/DL    Total Bilirubin 0.4 0.2 - 1.0 MG/DL    ALT 23 12 - 78 U/L    AST 32 15 - 37 U/L    Alk Phosphatase 81 45 - 117 U/L    Total Protein 6.0 (L) 6.4 - 8.2 g/dL    Albumin 2.8 (L) 3.5 - 5.0 g/dL    Globulin 3.2 2.0 - 4.0 g/dL    Albumin/Globulin Ratio 0.9 (L) 1.1 - 2.2     Lipase    Collection Time: 12/19/23  6:34 PM   Result Value Ref Range    Lipase 24 13 - 75 U/L   Troponin    Collection Time: 12/19/23  6:34 PM   Result Value Ref Range    Troponin, High Sensitivity 11 0 - 76 ng/L   Urinalysis with Reflex to Culture    Collection Time: 12/19/23  6:34 PM    Specimen: Urine   Result Value Ref Range    Color, UA YELLOW/STRAW      Appearance CLEAR CLEAR      Specific Gravity, UA <1.005     pH, Urine 5.5 5.0 - 8.0      Protein, UA Negative NEG mg/dL    Glucose, Ur Negative NEG mg/dL    Ketones, Urine Negative NEG mg/dL    Bilirubin, Urine Negative NEG      Blood, Urine Negative NEG      Urobilinogen, Urine 1.0 0.2 - 1.0 EU/dL    Nitrite, Urine Negative NEG      Leukocyte Esterase, Urine Negative NEG      WBC, UA 0-4 0 - 4 /hpf    RBC, UA 0-5 0 - 5 /hpf    Epithelial Cells, UA FEW FEW /lpf    BACTERIA, URINE Negative NEG /hpf    Urine Culture if Indicated CULTURE NOT INDICATED BY UA RESULT CNI     Brain  Natriuretic Peptide    Collection Time: 12/19/23  6:34 PM   Result Value Ref Range    NT Pro-BNP 25 0 - 125 PG/ML   Troponin    Collection Time: 12/19/23  8:22 PM   Result Value Ref Range    Troponin, High Sensitivity 10 0 - 76 ng/L       CTA CHEST W WO CONTRAST PE Eval   Final Result      1. Suboptimal for the assessment of pulm embolism due to respiratory artifacts.   No central pulmonary embolism demonstrated.   2. Prominent confluent opacification overlying the left rib cage with abundant   adjacent subcutaneous edema-like attenuation. Findings could represent hematoma   or mass or other process and should be correlated clinically. No underlying rib   fracture is shown.         Electronically signed by Nobie Putnam, MD        @CT48 @

## 2023-12-19 NOTE — ED Notes (Signed)
 Discharge instructions were given to the patient by [facility] RN.     The patient left the Emergency Department alert and oriented and in no acute distress with 1 prescription. The patient was encouraged to call or return to the ED for worsening issues or problems and was encouraged to schedule a follow up appointment for continuing care.     Ambulation assessment completed before discharge.  Pt left Emergency Department ambulating at baseline with no ortho devices  Ortho device education: none    The patient verbalized understanding of discharge instructions and prescriptions, all questions were answered. The patient has no further concerns at this time.
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
## 2023-12-19 NOTE — ED Triage Notes (Signed)
 Pt presents to the ED with complaints of SOB, dizziness and feeling fatigue for about 3 days. Pt also states he suffers from chronic pain in his back and bilateral hips. Pt states he has sciatica. Pt states that he thought a 40oz of beer would help him curb some of the pain but it didn't so he called EMS. Pt pain is 10/10. Pt denies taking any pain meds.
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
## 2023-12-20 ENCOUNTER — Inpatient Hospital Stay
Admit: 2023-12-20 | Discharge: 2023-12-20 | Disposition: A | Discharge status: Home / Self Care | Payer: MEDICARE | Attending: Emergency Medicine

## 2023-12-20 DIAGNOSIS — M545 Low back pain, unspecified: Secondary | ICD-10-CM

## 2023-12-20 DIAGNOSIS — G8929 Other chronic pain: Secondary | ICD-10-CM

## 2023-12-20 LAB — TROPONIN: Troponin, High Sensitivity: 10 ng/L (ref 0–76)

## 2023-12-20 MED ORDER — LIDOCAINE 4 % EX PTCH
4 | CUTANEOUS | Status: DC
Start: 2023-12-20 — End: 2023-12-20
  Administered 2023-12-20: 06:00:00 1 via TRANSDERMAL

## 2023-12-20 MED ORDER — LIDOCAINE 5 % EX PTCH
5 | MEDICATED_PATCH | Freq: Every day | CUTANEOUS | 0 refills | Status: AC
Start: 2023-12-20 — End: 2023-12-29

## 2023-12-20 MED ORDER — LIDOCAINE 5 % EX PTCH
5 | MEDICATED_PATCH | Freq: Every day | CUTANEOUS | 0 refills | Status: AC
Start: 2023-12-20 — End: 2023-12-30

## 2023-12-20 MED ORDER — NAPROXEN 500 MG PO TABS
500 | ORAL_TABLET | Freq: Two times a day (BID) | ORAL | 0 refills | Status: AC
Start: 2023-12-20 — End: ?

## 2023-12-20 MED ORDER — KETOROLAC TROMETHAMINE 30 MG/ML IJ SOLN
30 | Freq: Once | INTRAMUSCULAR | Status: AC
Start: 2023-12-20 — End: 2023-12-20
  Administered 2023-12-20: 06:00:00 30 mg via INTRAMUSCULAR

## 2023-12-20 MED FILL — SALONPAS PAIN RELIEVING 4 % EX PTCH: 4 % | CUTANEOUS | Qty: 1 | Fill #0

## 2023-12-20 MED FILL — KETOROLAC TROMETHAMINE 30 MG/ML IJ SOLN: 30 MG/ML | INTRAMUSCULAR | Qty: 1 | Fill #0
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
## 2023-12-20 NOTE — Discharge Instructions (Signed)
 Thank You!    It was a pleasure taking care of you in our Emergency Department today. We know that when you come to [facility], you are entrusting [place] with your health, comfort, and safety. Our physicians and nurses honor that trust, and truly appreciate the opportunity to care for you and your loved ones.      We also value your feedback. If you receive a survey about your Emergency Department experience today, please fill it out.  We care about our patients' feedback, and we listen to what you have to say.     Thank you again!    Dr. Jed Limerick, M.D.      ____________________________________________________________________  I have included a copy of your lab results and/or radiologic studies from today's visit so you can have them easily available at your follow-up visit. We hope you feel better and please do not hesitate to contact the ED if you have any questions at all!    Recent Results (from the past 12 hours)   EKG 12 Lead    Collection Time: 12/19/23  6:21 PM   Result Value Ref Range    Ventricular Rate 78 BPM    Atrial Rate 78 BPM    P-R Interval 146 ms    QRS Duration 92 ms    Q-T Interval 380 ms    QTc Calculation (Bazett) 433 ms    P Axis 54 degrees    R Axis 32 degrees    T Axis 25 degrees    Diagnosis       Normal sinus rhythm  Normal ECG  When compared with ECG of 22-Feb-2021 14:41,  No significant change was found     CBC with Auto Differential    Collection Time: 12/19/23  6:34 PM   Result Value Ref Range    WBC 5.1 4.1 - 11.1 K/uL    RBC 3.72 (L) 4.10 - 5.70 M/uL    Hemoglobin 11.8 (L) 12.1 - 17.0 g/dL    Hematocrit 16.1 (L) 36.6 - 50.3 %    MCV 89.8 80.0 - 99.0 FL    MCH 31.7 26.0 - 34.0 PG    MCHC 35.3 30.0 - 36.5 g/dL    RDW 09.6 04.5 - 40.9 %    Platelets 173 150 - 400 K/uL    MPV 9.9 8.9 - 12.9 FL    Nucleated RBCs 0.0 0 PER 100 WBC    nRBC 0.00 0.00 - 0.01 K/uL    Neutrophils % 64.7 32.0 - 75.0 %    Lymphocytes % 23.4 12.0 - 49.0 %    Monocytes % 10.5 5.0 - 13.0 %     Eosinophils % 0.8 0.0 - 7.0 %    Basophils % 0.4 0.0 - 1.0 %    Immature Granulocytes % 0.2 0.0 - 0.5 %    Neutrophils Absolute 3.32 1.80 - 8.00 K/UL    Lymphocytes Absolute 1.20 0.80 - 3.50 K/UL    Monocytes Absolute 0.54 0.00 - 1.00 K/UL    Eosinophils Absolute 0.04 0.00 - 0.40 K/UL    Basophils Absolute 0.02 0.00 - 0.10 K/UL    Immature Granulocytes Absolute 0.01 0.00 - 0.04 K/UL    Differential Type AUTOMATED     Comprehensive Metabolic Panel    Collection Time: 12/19/23  6:34 PM   Result Value Ref Range    Sodium 138 136 - 145 mmol/L    Potassium 3.7 3.5 - 5.1 mmol/L  Chloride 102 97 - 108 mmol/L    CO2 21 21 - 32 mmol/L    Anion Gap 15 (H) 2 - 12 mmol/L    Glucose 97 65 - 100 mg/dL    BUN 9 6 - 20 MG/DL    Creatinine 1.61 0.96 - 1.30 MG/DL    BUN/Creatinine Ratio 12 12 - 20      Est, Glom Filt Rate >90 >60 ml/min/1.22m2    Calcium 7.7 (L) 8.5 - 10.1 MG/DL    Total Bilirubin 0.4 0.2 - 1.0 MG/DL    ALT 23 12 - 78 U/L    AST 32 15 - 37 U/L    Alk Phosphatase 81 45 - 117 U/L    Total Protein 6.0 (L) 6.4 - 8.2 g/dL    Albumin 2.8 (L) 3.5 - 5.0 g/dL    Globulin 3.2 2.0 - 4.0 g/dL    Albumin/Globulin Ratio 0.9 (L) 1.1 - 2.2     Lipase    Collection Time: 12/19/23  6:34 PM   Result Value Ref Range    Lipase 24 13 - 75 U/L   Troponin    Collection Time: 12/19/23  6:34 PM   Result Value Ref Range    Troponin, High Sensitivity 11 0 - 76 ng/L   Urinalysis with Reflex to Culture    Collection Time: 12/19/23  6:34 PM    Specimen: Urine   Result Value Ref Range    Color, UA YELLOW/STRAW      Appearance CLEAR CLEAR      Specific Gravity, UA <1.005     pH, Urine 5.5 5.0 - 8.0      Protein, UA Negative NEG mg/dL    Glucose, Ur Negative NEG mg/dL    Ketones, Urine Negative NEG mg/dL    Bilirubin, Urine Negative NEG      Blood, Urine Negative NEG      Urobilinogen, Urine 1.0 0.2 - 1.0 EU/dL    Nitrite, Urine Negative NEG      Leukocyte Esterase, Urine Negative NEG      WBC, UA 0-4 0 - 4 /hpf    RBC, UA 0-5 0 - 5 /hpf     Epithelial Cells, UA FEW FEW /lpf    BACTERIA, URINE Negative NEG /hpf    Urine Culture if Indicated CULTURE NOT INDICATED BY UA RESULT CNI     Brain Natriuretic Peptide    Collection Time: 12/19/23  6:34 PM   Result Value Ref Range    NT Pro-BNP 25 0 - 125 PG/ML   Troponin    Collection Time: 12/19/23  8:22 PM   Result Value Ref Range    Troponin, High Sensitivity 10 0 - 76 ng/L       No orders to display     The exam and treatment you received in the Emergency Department were for an urgent problem and are not intended as complete care. It is important that you follow up with a doctor, nurse practitioner, or physician assistant for ongoing care. If your symptoms become worse or you do not improve as expected and you are unable to reach your usual health care provider, you should return to the Emergency Department. We are available 24 hours a day.    Please take your discharge instructions with you when you go to your follow-up appointment.     If a prescription has been provided, please have it filled as soon as possible to prevent a delay in treatment. Read the  entire medication instruction sheet provided to you by the pharmacy. If you have any questions or reservations about taking the medication due to side effects or interactions with other medications, please call your primary care physician or contact the ER to speak with the charge nurse.     Please make an appointment with your family doctor or the physician you were referred to for follow-up of this visit as instructed on your discharge paperwork. Return to the ER if you are unable to be seen or if you are unable to be seen in a timely manner.    If you have any problem arranging the follow-up visit, contact the Emergency Department immediately.
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
## 2023-12-22 LAB — EKG 12-LEAD
Atrial Rate: 78 {beats}/min
Diagnosis: NORMAL
P Axis: 54 degrees
P-R Interval: 146 ms
Q-T Interval: 380 ms
QRS Duration: 92 ms
QTc Calculation (Bazett): 433 ms
R Axis: 32 degrees
T Axis: 25 degrees
Ventricular Rate: 78 {beats}/min

## 2024-02-05 ENCOUNTER — Emergency Department: Payer: Medicare (Managed Care)

## 2024-02-05 ENCOUNTER — Inpatient Hospital Stay
Admit: 2024-02-05 | Discharge: 2024-02-06 | Disposition: A | Discharge status: Home / Self Care | Payer: Medicare (Managed Care) | Attending: Emergency Medicine

## 2024-02-05 ENCOUNTER — Emergency Department: Admit: 2024-02-05 | Payer: Medicare (Managed Care)

## 2024-02-05 DIAGNOSIS — R0602 Shortness of breath: Secondary | ICD-10-CM

## 2024-02-05 LAB — CBC WITH AUTO DIFFERENTIAL
Basophils %: 0.5 % (ref 0.0–1.0)
Basophils Absolute: 0.03 10*3/uL (ref 0.00–0.10)
Eosinophils %: 0.2 % (ref 0.0–7.0)
Eosinophils Absolute: 0.01 10*3/uL (ref 0.00–0.40)
Hematocrit: 43.9 % (ref 36.6–50.3)
Hemoglobin: 14.7 g/dL (ref 12.1–17.0)
Immature Granulocytes %: 0.2 % (ref 0.0–0.5)
Immature Granulocytes Absolute: 0.01 10*3/uL (ref 0.00–0.04)
Lymphocytes %: 14.4 % (ref 12.0–49.0)
Lymphocytes Absolute: 0.86 10*3/uL (ref 0.80–3.50)
MCH: 31.3 pg (ref 26.0–34.0)
MCHC: 33.5 g/dL (ref 30.0–36.5)
MCV: 93.6 FL (ref 80.0–99.0)
MPV: 9.5 FL (ref 8.9–12.9)
Monocytes %: 5.5 % (ref 5.0–13.0)
Monocytes Absolute: 0.33 10*3/uL (ref 0.00–1.00)
Neutrophils %: 79.2 % — ABNORMAL HIGH (ref 32.0–75.0)
Neutrophils Absolute: 4.74 10*3/uL (ref 1.80–8.00)
Nucleated RBCs: 0 /100{WBCs}
Platelets: 281 10*3/uL (ref 150–400)
RBC: 4.69 M/uL (ref 4.10–5.70)
RDW: 12.2 % (ref 11.5–14.5)
WBC: 6 10*3/uL (ref 4.1–11.1)
nRBC: 0 10*3/uL (ref 0.00–0.01)

## 2024-02-05 LAB — COMPREHENSIVE METABOLIC PANEL
ALT: 18 U/L (ref 12–78)
AST: 16 U/L (ref 15–37)
Albumin/Globulin Ratio: 0.9 — ABNORMAL LOW (ref 1.1–2.2)
Albumin: 3.8 g/dL (ref 3.5–5.0)
Alk Phosphatase: 122 U/L — ABNORMAL HIGH (ref 45–117)
Anion Gap: 4 mmol/L (ref 2–12)
BUN/Creatinine Ratio: 14 (ref 12–20)
BUN: 14 mg/dL (ref 6–20)
CO2: 28 mmol/L (ref 21–32)
Calcium: 9.3 mg/dL (ref 8.5–10.1)
Chloride: 103 mmol/L (ref 97–108)
Creatinine: 0.99 mg/dL (ref 0.70–1.30)
Est, Glom Filt Rate: 85 mL/min/{1.73_m2} (ref >60)
Globulin: 4.3 g/dL — ABNORMAL HIGH (ref 2.0–4.0)
Glucose: 92 mg/dL (ref 65–100)
Potassium: 3.6 mmol/L (ref 3.5–5.1)
Sodium: 135 mmol/L — ABNORMAL LOW (ref 136–145)
Total Bilirubin: 1.1 mg/dL — ABNORMAL HIGH (ref 0.2–1.0)
Total Protein: 8.1 g/dL (ref 6.4–8.2)

## 2024-02-05 LAB — TROPONIN
Troponin, High Sensitivity: 7 ng/L (ref 0–76)
Troponin, High Sensitivity: 10 ng/L (ref 0–76)
Troponin, High Sensitivity: 10 ng/L (ref 0–76)

## 2024-02-05 LAB — COVID-19 & INFLUENZA COMBO
Rapid Influenza A By PCR: NOT DETECTED
Rapid Influenza B By PCR: NOT DETECTED
SARS-CoV-2, PCR: NOT DETECTED

## 2024-02-05 LAB — EKG 12-LEAD
Atrial Rate: 83 {beats}/min
Diagnosis: NORMAL
P Axis: 79 degrees
P-R Interval: 144 ms
Q-T Interval: 366 ms
QRS Duration: 92 ms
QTc Calculation (Bazett): 430 ms
R Axis: 71 degrees
T Axis: 21 degrees
Ventricular Rate: 83 {beats}/min

## 2024-02-05 LAB — MAGNESIUM: Magnesium: 1.9 mg/dL (ref 1.6–2.4)

## 2024-02-05 LAB — BRAIN NATRIURETIC PEPTIDE: NT Pro-BNP: 16 pg/mL (ref <125)

## 2024-02-05 MED ORDER — IOPAMIDOL 76 % IV SOLN
76 | Freq: Once | INTRAVENOUS | Status: AC | PRN
Start: 2024-02-05 — End: 2024-02-05
  Administered 2024-02-05: 21:00:00 70 mL via INTRAVENOUS

## 2024-02-05 MED ORDER — ALBUTEROL SULFATE HFA 108 (90 BASE) MCG/ACT IN AERS
108 | Freq: Four times a day (QID) | RESPIRATORY_TRACT | 0 refills | 25.00 days | Status: AC | PRN
Start: 2024-02-05 — End: ?

## 2024-02-05 MED FILL — ISOVUE-370 76 % IV SOLN: 76 % | INTRAVENOUS | Qty: 100 | Fill #0

## 2024-02-05 NOTE — ED Notes (Signed)
 Discharge medications reviewed with the patient and appropriate educational materials and side effects teaching were provided.

## 2024-02-05 NOTE — ED Provider Notes (Signed)
 EMERGENCY DEPARTMENT HISTORY AND PHYSICAL EXAM      Date: 02/05/2024  Patient Name: Ronald Kennedy    History of Presenting Illness     Chief Complaint   Patient presents with    Shortness of Breath     Pt BIB EMS [place] bus stop. Pt c/o SOB x 2 months. Pt SOB got worse today, with CP, and feeling lightheaded. Pt has hx of blood clots, and states he is no longer on blood thinners         HPI: History From: patient, History limited by: none  Ronald Kennedy, 63 y.o. male presents to the ED with cc of shortness of breath.  About an hour and a half prior to arrival he was walking when he began to have shortness of breath, a central dull chest pain and sweatiness.  He sat down, the pain has since resolved.  He still feels a bit short of breath.  He states that he felt similarly when he was diagnosed with a pulmonary embolism about 3 years ago.  He denies leg pain or leg swelling, no fever or cough.  He reports being on Eliquis for about 4 months after his prior diagnosis of PE, he is unsure if there was an identifiable cause of it.  He denies any other medical problems, however states he has not seen a doctor in about 2 years.  He does not take any daily medications.  He denies tobacco, reports occasional alcohol, denies illicit drug use.  Denies any family history of cardiac disease.          There are no other complaints, changes, or physical findings at this time.    PCP: Linnette Richer, MD    No current facility-administered medications on file prior to encounter.     Current Outpatient Medications on File Prior to Encounter   Medication Sig Dispense Refill    naproxen  (NAPROSYN ) 500 MG tablet Take 1 tablet by mouth 2 times daily 60 tablet 0    predniSONE  10 MG (21) TBPK Follow instructions on package (Patient not taking: Reported on 12/19/2023) 1 each 0    diclofenac  sodium (VOLTAREN ) 1 % GEL Apply 4 g topically 4 times daily as needed for Pain (Patient not taking: Reported on 12/19/2023) 150 g 0       Past History      Past Medical History:  Past Medical History:   Diagnosis Date    Degenerative joint disease of both hips     Multilevel degenerative disc disease     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    Social History:  Social History     Tobacco Use    Smoking status: Never    Smokeless tobacco: Never   Substance Use Topics    Alcohol use: Yes    Drug use: Never       Allergies:  No Known Allergies      Physical Exam   Physical Exam  Constitutional:       General: He is not in acute distress.     Appearance: He is not toxic-appearing.   HENT:      Head:  Normocephalic and atraumatic.   Cardiovascular:      Rate and Rhythm: Normal rate and regular rhythm.   Pulmonary:      Effort: Pulmonary effort is normal.      Breath sounds: Normal breath sounds.   Abdominal:      Palpations: Abdomen is soft.      Tenderness: There is no abdominal tenderness.   Musculoskeletal:      Cervical back: Neck supple.      Right lower leg: No tenderness. No edema.      Left lower leg: No tenderness. No edema.   Skin:     General: Skin is warm.   Neurological:      General: No focal deficit present.      Mental Status: He is alert.   Psychiatric:         Mood and Affect: Mood normal.         Diagnostic Study Results     Labs -     Recent Results (from the past 24 hours)   EKG 12 Lead    Collection Time: 02/05/24  3:20 PM   Result Value Ref Range    Ventricular Rate 83 BPM    Atrial Rate 83 BPM    P-R Interval 144 ms    QRS Duration 92 ms    Q-T Interval 366 ms    QTc Calculation (Bazett) 430 ms    P Axis 79 degrees    R Axis 71 degrees    T Axis 21 degrees    Diagnosis       Normal sinus rhythm  Nonspecific T wave abnormality  Abnormal ECG  When compared with ECG of 19-Dec-2023 18:21,  Nonspecific T wave abnormality  now evident in Lateral leads  Confirmed by Min-Laurrie Toppin, MD, Anastacio Balm [phone]) on 02/05/2024 4:30:17 PM     CBC with Auto Differential    Collection Time: 02/05/24  3:29 PM   Result Value Ref Range    WBC 6.0 4.1 - 11.1 K/uL    RBC 4.69 4.10 - 5.70 M/uL    Hemoglobin 14.7 12.1 - 17.0 g/dL    Hematocrit 60.4 54.0 - 50.3 %    MCV 93.6 80.0 - 99.0 FL    MCH 31.3 26.0 - 34.0 PG    MCHC 33.5 30.0 - 36.5 g/dL    RDW 98.1 19.1 - 47.8 %    Platelets 281 150 - 400 K/uL    MPV 9.5 8.9 - 12.9 FL    Nucleated RBCs 0.0 0 PER 100 WBC    nRBC 0.00 0.00 - 0.01 K/uL    Neutrophils % 79.2 (H) 32.0 - 75.0 %    Lymphocytes % 14.4 12.0 - 49.0 %    Monocytes % 5.5 5.0 - 13.0 %    Eosinophils % 0.2 0.0 - 7.0 %    Basophils % 0.5 0.0 - 1.0 %    Immature Granulocytes % 0.2 0.0 - 0.5 %    Neutrophils Absolute 4.74 1.80 - 8.00 K/UL    Lymphocytes Absolute 0.86 0.80 - 3.50 K/UL    Monocytes Absolute 0.33 0.00 - 1.00 K/UL    Eosinophils Absolute 0.01 0.00 - 0.40 K/UL    Basophils Absolute 0.03 0.00 - 0.10 K/UL    Immature Granulocytes Absolute 0.01 0.00 - 0.04 K/UL    Differential Type AUTOMATED     Comprehensive Metabolic Panel    Collection Time: 02/05/24  3:29 PM   Result Value Ref Range  Sodium 135 (L) 136 - 145 mmol/L    Potassium 3.6 3.5 - 5.1 mmol/L    Chloride 103 97 - 108 mmol/L    CO2 28 21 - 32 mmol/L    Anion Gap 4 2 - 12 mmol/L    Glucose 92 65 - 100 mg/dL    BUN 14 6 - 20 MG/DL    Creatinine 1.61 0.96 - 1.30 MG/DL    BUN/Creatinine Ratio 14 12 - 20      Est, Glom Filt Rate 85 >60 ml/min/1.70m2    Calcium 9.3 8.5 - 10.1 MG/DL    Total Bilirubin 1.1 (H) 0.2 - 1.0 MG/DL    ALT 18 12 - 78 U/L    AST 16 15 - 37 U/L    Alk Phosphatase 122 (H) 45 - 117 U/L    Total Protein 8.1 6.4 - 8.2 g/dL    Albumin 3.8 3.5 - 5.0 g/dL    Globulin 4.3 (H) 2.0 - 4.0 g/dL    Albumin/Globulin Ratio 0.9 (L) 1.1 - 2.2     Magnesium    Collection Time: 02/05/24  3:29 PM   Result Value Ref Range    Magnesium 1.9 1.6 - 2.4 mg/dL   Troponin "IF" patient is  greater than 36 years of age or has a history of cardiac disease.    Collection Time: 02/05/24  3:29 PM   Result Value Ref Range    Troponin, High Sensitivity 7 0 - 76 ng/L   COVID-19 & Influenza Combo    Collection Time: 02/05/24  3:29 PM    Specimen: Nasopharyngeal   Result Value Ref Range    Source Nasopharyngeal      SARS-CoV-2, PCR Not detected NOTD      Rapid Influenza A By PCR Not detected NOTD      Rapid Influenza B By PCR Not detected NOTD     Brain Natriuretic Peptide    Collection Time: 02/05/24  3:29 PM   Result Value Ref Range    NT Pro-BNP 16 <125 PG/ML   Troponin    Collection Time: 02/05/24  5:25 PM   Result Value Ref Range    Troponin, High Sensitivity 10 0 - 76 ng/L   EKG 12 Lead (Chest Pain)    Collection Time: 02/05/24  5:27 PM   Result Value Ref Range    Ventricular Rate 67 BPM    Atrial Rate 67 BPM    P-R Interval 100 ms    QRS Duration 88 ms    Q-T Interval 402 ms    QTc Calculation (Bazett) 424 ms    P Axis 84 degrees    R Axis 50 degrees    T Axis 59 degrees    Diagnosis       Sinus rhythm with short PR    Confirmed by Min-Viann Nielson, MD, Anastacio Balm [phone]) on 02/05/2024 8:47:23 PM     Troponin    Collection Time: 02/05/24  6:57 PM   Result Value Ref Range    Troponin, High Sensitivity 10 0 - 76 ng/L       Radiologic Studies -   CTA CHEST W WO CONTRAST PE Eval   Final Result      1. No acute PE               Electronically signed by Avanell Bob              Medical Decision Making   I am the first provider for  this patient.    I reviewed the vital signs, available nursing notes, past medical history, past surgical history, family history and social history.    Vital Signs-Reviewed the patient's vital signs.  Patient Vitals for the past 12 hrs:   Temp Pulse Resp BP SpO2   02/05/24 1830 -- 80 18 (!) 142/90 96 %   02/05/24 1800 -- -- -- (!) 140/89 92 %   02/05/24 1745 -- 69 -- (!) 146/92 95 %   02/05/24 1700 -- 75 -- (!) 143/87 93 %   02/05/24 1517 98.2 F (36.8 C) 81 18 (!) 140/97 97 %          Provider Notes (Medical Decision Making):   63 year old presenting with shortness of breath and chest pain.  Differential includes ACS, angina, CHF, PE, GERD or esophageal spasm, bronchitis or respiratory infection.  He denies any current chest pain, is well-appearing with normal vitals, saturating well on room air.  He denies ripping or tearing pain, no radiation to the back, he is comfortable appearing, pain-free as above, unlikely dissection.    ED Course:     [name]    Medications   iopamidol  (ISOVUE -370) 76 % injection 100 mL (70 mLs IntraVENous Given 02/05/24 1719)         EKG is performed at 15: 20, independently interpreted by myself is sinus rhythm at a rate of 83, no ST segment elevation or depression concerning for ACS    Troponin reassuring at 10, CBC negative for leukocytosis or anemia, COVID and flu test are negative.    Repeat troponin reassuring at 10.  He is resting comfortably on reevaluation with unremarkable vitals, no further chest pain throughout stay.  CTA shows no acute PE, he is  CTA shows no acute PE, he is informed of chronic debris.    Clinical Management Tools:  Not Applicable, Chest Pain: MEDICAL DECISION MAKING:   I considered, but did not perform, additional testing such CT Angiogram, as well as admission or transfer to a higher level of care.     I utilized an evidence-based risk rating tool (CMT) along with my training and experience to weigh the risk of discharge against the risks of further testing, imaging, or hospitalization. At this time, I estimate the risks of additional testing, imaging, or hospitalization to be equal to or greater than the risk of discharge(in the case of discharge home).      The patient's HEART Score is 3. In rare cases, I give patients with HEART Score of 4 the option of discharge, but only when they meet criteria for "Low 4," meaning that HST was used, and the 4 is not from a highly suspicious story, highly suspicious EKG, or positive cardiac enzymes.   In these selected cases, the risk of a "Low 4" is still most likely lower than the risk of admission and further testing/imaging. [id number]    SHARED DECISION MAKING:   I discussed my risk assessment with the patient. The patient understands and consents to the risk of disposition/plan, as well as the risk of uncertainty in estimating outcomes. [id number]           Critical Care Time:         Disposition:  Home  {Shulem Accardi's  results have been reviewed with him.  He has been counseled regarding his diagnosis, treatment, and plan.  He verbally conveys understanding and agreement of the signs, symptoms, diagnosis, treatment and prognosis and additionally agrees to follow up as discussed.  He also agrees with the care-plan and conveys that all of his questions have been answered.  I have also provided discharge instructions for him that include: educational information regarding their diagnosis and treatment, and list of reasons why they would want to return to the ED prior to their follow-up appointment, should his condition change.      PLAN:  1.      Medication List        START taking these medications      albuterol sulfate HFA 108 (90 Base) MCG/ACT inhaler  Commonly known as: Ventolin HFA  Inhale 2 puffs into the lungs 4 times daily as needed for Wheezing            ASK your doctor about these medications      diclofenac  sodium 1 % Gel  Commonly known as: VOLTAREN   Apply 4 g topically 4 times daily as needed for Pain     naproxen  500 MG tablet  Commonly known as: Naprosyn   Take 1 tablet by mouth 2 times daily     predniSONE  10 MG (21) Tbpk  Follow instructions on package               Where to Get Your Medications        These medications were sent to [id number], [place] - [address] - P [phone] Kaylene Pascal [phone]  [address], [place] [address]      Phone: [phone]   albuterol sulfate HFA 108 (90 Base) MCG/ACT inhaler       2. Your  primary care doctor    Schedule an appointment as soon as possible for a visit in 4 days      [facility]  [address]  [address]  [place]  [address]  Schedule an appointment as soon as possible for a visit in 2 days      [facility]  [address]  [place]  [phone]  [phone]    As needed, If symptoms worsen    Return to ED if worse     Diagnosis     Clinical Impression: Acute chest pain and shortness of breath        All EKGs are independently interpreted by myself       Darris Emery, MD  02/05/24 [phone]

## 2024-02-06 LAB — EKG 12-LEAD
Atrial Rate: 67 {beats}/min
P Axis: 84 degrees
P-R Interval: 100 ms
Q-T Interval: 402 ms
QRS Duration: 88 ms
QTc Calculation (Bazett): 424 ms
R Axis: 50 degrees
T Axis: 59 degrees
Ventricular Rate: 67 {beats}/min

## 2024-02-07 ENCOUNTER — Inpatient Hospital Stay
Admission: EM | Admit: 2024-02-07 | Discharge: 2024-02-11 | Disposition: A | Discharge status: Home / Self Care | Payer: Medicare (Managed Care) | Admitting: Student in an Organized Health Care Education/Training Program

## 2024-02-07 ENCOUNTER — Inpatient Hospital Stay: Admit: 2024-02-07 | Payer: Medicare (Managed Care)

## 2024-02-07 DIAGNOSIS — R0609 Other forms of dyspnea: Principal | ICD-10-CM

## 2024-02-07 DIAGNOSIS — J9601 Acute respiratory failure with hypoxia: Principal | ICD-10-CM

## 2024-02-07 LAB — COMPREHENSIVE METABOLIC PANEL
ALT: 17 U/L (ref 12–78)
AST: 17 U/L (ref 15–37)
Albumin/Globulin Ratio: 1.1 (ref 1.1–2.2)
Albumin: 4 g/dL (ref 3.5–5.0)
Alk Phosphatase: 124 U/L — ABNORMAL HIGH (ref 45–117)
Anion Gap: 14 mmol/L — ABNORMAL HIGH (ref 2–12)
BUN/Creatinine Ratio: 14 (ref 12–20)
BUN: 12 mg/dL (ref 6–20)
CO2: 20 mmol/L — ABNORMAL LOW (ref 21–32)
Calcium: 9.6 mg/dL (ref 8.5–10.1)
Chloride: 103 mmol/L (ref 97–108)
Creatinine: 0.87 mg/dL (ref 0.70–1.30)
Est, Glom Filt Rate: >90 mL/min/{1.73_m2} (ref >60)
Globulin: 3.8 g/dL (ref 2.0–4.0)
Glucose: 78 mg/dL (ref 65–100)
Potassium: 3.6 mmol/L (ref 3.5–5.1)
Sodium: 137 mmol/L (ref 136–145)
Total Bilirubin: 1.4 mg/dL — ABNORMAL HIGH (ref 0.2–1.0)
Total Protein: 7.8 g/dL (ref 6.4–8.2)

## 2024-02-07 LAB — URINALYSIS WITH MICROSCOPIC
BACTERIA, URINE: NEGATIVE /HPF
Blood, Urine: NEGATIVE
Glucose, Ur: NEGATIVE mg/dL
Ketones, Urine: 80 mg/dL — AB
Leukocyte Esterase, Urine: NEGATIVE
Nitrite, Urine: NEGATIVE
Protein, UA: 30 mg/dL — AB
Urobilinogen, Urine: 2 EU/dL — ABNORMAL HIGH (ref 0.2–1.0)
pH, Urine: 5.5 (ref 5.0–8.0)

## 2024-02-07 LAB — CBC WITH AUTO DIFFERENTIAL
Basophils %: 0.5 % (ref 0.0–1.0)
Basophils Absolute: 0.03 10*3/uL (ref 0.00–0.10)
Eosinophils %: 0.2 % (ref 0.0–7.0)
Eosinophils Absolute: 0.01 10*3/uL (ref 0.00–0.40)
Hematocrit: 41.3 % (ref 36.6–50.3)
Hemoglobin: 13.9 g/dL (ref 12.1–17.0)
Immature Granulocytes %: 0.2 % (ref 0.0–0.5)
Immature Granulocytes Absolute: 0.01 10*3/uL (ref 0.00–0.04)
Lymphocytes %: 16.6 % (ref 12.0–49.0)
Lymphocytes Absolute: 0.94 10*3/uL (ref 0.80–3.50)
MCH: 30.8 pg (ref 26.0–34.0)
MCHC: 33.7 g/dL (ref 30.0–36.5)
MCV: 91.4 FL (ref 80.0–99.0)
MPV: 9.3 FL (ref 8.9–12.9)
Monocytes %: 6.2 % (ref 5.0–13.0)
Monocytes Absolute: 0.35 10*3/uL (ref 0.00–1.00)
Neutrophils %: 76.3 % — ABNORMAL HIGH (ref 32.0–75.0)
Neutrophils Absolute: 4.32 10*3/uL (ref 1.80–8.00)
Nucleated RBCs: 0 /100{WBCs}
Platelets: 255 10*3/uL (ref 150–400)
RBC: 4.52 M/uL (ref 4.10–5.70)
RDW: 12.3 % (ref 11.5–14.5)
WBC: 5.7 10*3/uL (ref 4.1–11.1)
nRBC: 0 10*3/uL (ref 0.00–0.01)

## 2024-02-07 LAB — C-REACTIVE PROTEIN: CRP: 0.33 mg/dL — ABNORMAL HIGH (ref 0.00–0.30)

## 2024-02-07 LAB — TROPONIN: Troponin, High Sensitivity: 11 ng/L (ref 0–76)

## 2024-02-07 LAB — EKG 12-LEAD
Atrial Rate: 70 {beats}/min
Diagnosis: NORMAL
P Axis: 87 degrees
P-R Interval: 136 ms
Q-T Interval: 404 ms
QRS Duration: 96 ms
QTc Calculation (Bazett): 436 ms
R Axis: 86 degrees
T Axis: 85 degrees
Ventricular Rate: 70 {beats}/min

## 2024-02-07 LAB — PROCALCITONIN: Procalcitonin: <0.05 ng/mL

## 2024-02-07 LAB — BRAIN NATRIURETIC PEPTIDE: NT Pro-BNP: 21 pg/mL (ref <125)

## 2024-02-07 LAB — EXTRA TUBES HOLD

## 2024-02-07 LAB — BILIRUBIN, CONFIRMATORY: Bilirubin Confirmation, UA: NEGATIVE

## 2024-02-07 LAB — PHOSPHORUS: Phosphorus: 2.8 mg/dL (ref 2.6–4.7)

## 2024-02-07 LAB — MAGNESIUM: Magnesium: 1.9 mg/dL (ref 1.6–2.4)

## 2024-02-07 LAB — D-DIMER, QUANTITATIVE: D-Dimer, Quant: 3.16 mg{FEU}/L — ABNORMAL HIGH (ref 0.00–0.65)

## 2024-02-07 MED ORDER — NORMAL SALINE FLUSH 0.9 % IV SOLN
0.9 | INTRAVENOUS | Status: DC | PRN
Start: 2024-02-07 — End: 2024-02-10

## 2024-02-07 MED ORDER — ENOXAPARIN SODIUM 30 MG/0.3ML IJ SOSY
30 | Freq: Two times a day (BID) | INTRAMUSCULAR | Status: DC
Start: 2024-02-07 — End: 2024-02-10
  Administered 2024-02-07 – 2024-02-10 (×7): 30 mg via SUBCUTANEOUS

## 2024-02-07 MED ORDER — STERILE WATER FOR INJECTION (MIXTURES ONLY)
40 | Freq: Two times a day (BID) | INTRAMUSCULAR | Status: AC
Start: 2024-02-07 — End: 2024-02-09
  Administered 2024-02-08 – 2024-02-09 (×4): 40 mg via INTRAVENOUS

## 2024-02-07 MED ORDER — IPRATROPIUM-ALBUTEROL 0.5-2.5 (3) MG/3ML IN SOLN
0.5-2.5 | RESPIRATORY_TRACT | Status: DC
Start: 2024-02-07 — End: 2024-02-08
  Administered 2024-02-07 – 2024-02-08 (×3): 1 via RESPIRATORY_TRACT

## 2024-02-07 MED ORDER — POTASSIUM CHLORIDE CRYS ER 20 MEQ PO TBCR
20 | ORAL | Status: DC | PRN
Start: 2024-02-07 — End: 2024-02-10

## 2024-02-07 MED ORDER — BENZONATATE 100 MG PO CAPS
100 | Freq: Three times a day (TID) | ORAL | Status: DC | PRN
Start: 2024-02-07 — End: 2024-02-10

## 2024-02-07 MED ORDER — POLYETHYLENE GLYCOL 3350 17 G PO PACK
17 | Freq: Every day | ORAL | Status: DC | PRN
Start: 2024-02-07 — End: 2024-02-10

## 2024-02-07 MED ORDER — POTASSIUM BICARB-CITRIC ACID 20 MEQ PO TBEF
20 | ORAL | Status: DC | PRN
Start: 2024-02-07 — End: 2024-02-10

## 2024-02-07 MED ORDER — MAGNESIUM SULFATE 2000 MG/50 ML IVPB PREMIX
2 | INTRAVENOUS | Status: DC | PRN
Start: 2024-02-07 — End: 2024-02-10

## 2024-02-07 MED ORDER — ACETAMINOPHEN 650 MG RE SUPP
650 | Freq: Four times a day (QID) | RECTAL | Status: DC | PRN
Start: 2024-02-07 — End: 2024-02-10

## 2024-02-07 MED ORDER — MELATONIN 3 MG PO TABS
3 | Freq: Every evening | ORAL | Status: DC | PRN
Start: 2024-02-07 — End: 2024-02-10

## 2024-02-07 MED ORDER — POTASSIUM CHLORIDE 10 MEQ/100ML IV SOLN
10 | INTRAVENOUS | Status: DC | PRN
Start: 2024-02-07 — End: 2024-02-10

## 2024-02-07 MED ORDER — NORMAL SALINE FLUSH 0.9 % IV SOLN
0.9 | Freq: Two times a day (BID) | INTRAVENOUS | Status: DC
Start: 2024-02-07 — End: 2024-02-10
  Administered 2024-02-09 – 2024-02-10 (×3): 10 mL via INTRAVENOUS

## 2024-02-07 MED ORDER — METHYLPREDNISOLONE SODIUM SUCC 125 MG IJ SOLR
125 | Freq: Once | INTRAMUSCULAR | Status: AC
Start: 2024-02-07 — End: 2024-02-07
  Administered 2024-02-07: 19:00:00 60 mg via INTRAVENOUS

## 2024-02-07 MED ORDER — PREDNISONE 20 MG PO TABS
20 | Freq: Every day | ORAL | Status: DC
Start: 2024-02-07 — End: 2024-02-10
  Administered 2024-02-10: 13:00:00 40 mg via ORAL

## 2024-02-07 MED ORDER — ONDANSETRON HCL 4 MG/2ML IJ SOLN
4 | INTRAMUSCULAR | Status: DC | PRN
Start: 2024-02-07 — End: 2024-02-10

## 2024-02-07 MED ORDER — OXYCODONE HCL 5 MG PO TABS
5 | ORAL | Status: AC | PRN
Start: 2024-02-07 — End: 2024-02-09
  Administered 2024-02-07 – 2024-02-09 (×2): 5 mg via ORAL

## 2024-02-07 MED ORDER — SODIUM CHLORIDE 0.9 % IV SOLN
0.9 | INTRAVENOUS | Status: DC | PRN
Start: 2024-02-07 — End: 2024-02-10

## 2024-02-07 MED ORDER — ACETAMINOPHEN 325 MG PO TABS
325 | ORAL | Status: DC | PRN
Start: 2024-02-07 — End: 2024-02-10
  Administered 2024-02-10: 13:00:00 650 mg via ORAL

## 2024-02-07 MED ORDER — AZITHROMYCIN 250 MG PO TABS
250 | ORAL | Status: AC
Start: 2024-02-07 — End: 2024-02-09
  Administered 2024-02-07 – 2024-02-09 (×3): 500 mg via ORAL

## 2024-02-07 MED ORDER — NORMAL SALINE FLUSH 0.9 % IV SOLN
0.9 | Freq: Two times a day (BID) | INTRAVENOUS | Status: DC
Start: 2024-02-07 — End: 2024-02-10
  Administered 2024-02-08 – 2024-02-10 (×5): 10 mL via INTRAVENOUS

## 2024-02-07 MED ORDER — ACETAMINOPHEN 325 MG PO TABS
325 | Freq: Four times a day (QID) | ORAL | Status: DC | PRN
Start: 2024-02-07 — End: 2024-02-10

## 2024-02-07 MED FILL — OXYCODONE HCL 5 MG PO TABS: 5 MG | ORAL | Qty: 1 | Fill #0

## 2024-02-07 MED FILL — AZITHROMYCIN 250 MG PO TABS: 250 MG | ORAL | Qty: 2 | Fill #0

## 2024-02-07 MED FILL — METHYLPREDNISOLONE SODIUM SUCC 125 MG IJ SOLR: 125 MG | INTRAMUSCULAR | Qty: 125 | Fill #0

## 2024-02-07 MED FILL — ENOXAPARIN SODIUM 30 MG/0.3ML IJ SOSY: 30 MG/0.3ML | INTRAMUSCULAR | Qty: 0.3 | Fill #0

## 2024-02-07 MED FILL — IPRATROPIUM-ALBUTEROL 0.5-2.5 (3) MG/3ML IN SOLN: 0.5-2.5 (3) MG/3ML | RESPIRATORY_TRACT | Qty: 3 | Fill #0

## 2024-02-07 NOTE — Progress Notes (Signed)
 Nurse Sarah attempted to contact  at this number  regarding patient's personal property inhaler .  The plan is for  to pick up personal property or disposition as defined by [facility] Patient Own Medication policy.      Patient states that there is no one to call to have medication picked up. Medication as of 1625 have been locked in the box in the ED per security

## 2024-02-07 NOTE — ED Triage Notes (Signed)
 Patient arrives to ED via EMS from outside of [facility]. Patient states shortness of breath with chest pain and dizziness with exertion. Patient used inhaler without relief. Patient denies symptoms at rest. BG 112.

## 2024-02-07 NOTE — Plan of Care (Signed)
 Problem: Discharge Planning  Goal: Discharge to home or other facility with appropriate resources  02/07/2024 2121 by Alda Amas, RN  Outcome: Progressing  02/07/2024 1408 by Earna Go, RN  Outcome: Progressing     Problem: Pain  Goal: Verbalizes/displays adequate comfort level or baseline comfort level  02/07/2024 2121 by Alda Amas, RN  Outcome: Progressing  02/07/2024 1408 by Earna Go, RN  Outcome: Progressing     Problem: Safety - Adult  Goal: Free from fall injury  Outcome: Progressing     Problem: Respiratory - Adult  Goal: Achieves optimal ventilation and oxygenation  02/07/2024 2121 by Hendricks Schwandt, RN  Outcome: Progressing  02/07/2024 2049 by Earla Glassman R, RT  Outcome: Progressing  02/07/2024 1720 by Andree Bane, RT  Outcome: Progressing

## 2024-02-07 NOTE — H&P (Signed)
 Hospitalist Admission Note     Demographics    Patient Name  Ronald Kennedy   Date of Birth 10/27/1960   Medical Record Number  [id number]    Age  63 y.o.   PCP No primary care provider on file.   Admit date:  02/07/2024 10:26 AM   Date of Service 02/07/24   Room Number  FT02/02    @ [facility]     Admission Diagnosis:  Acute respiratory failure with hypoxia [facility])      Active Problem List:  Active Hospital Problems    Diagnosis Date Noted    Acute respiratory failure with hypoxia (HCC) 02/07/2024     Priority: High    Heart palpitations 06/02/2020     Priority: High    Exertional dyspnea 06/01/2020     Priority: High    Acute on chronic low back pain 02/07/2024     Priority: Medium        Integrated HPI // Assessment & Plan:   ED Consult Reason for Admission: acute hypoxic respiratory failure    ED Report // Course --> "63 y.o. 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 Patient was seen in this morning department 2 days ago and had a full workup including a CTA which was negative. Presents today for persistent symptoms. Labs unremarkable. X-ray negative. Patient desats to 78% with ambulation that was not hypoxic when sitting in bed."    Assessment // Plan (see active problem list above)   63 y.o. male with an updated PMHx below, admitted for acute hypoxic respiratory failure of unknown etiology. Persistent for several days, hypoxic with minimal exertion to the 70s. Does have a history of PE (completed 3 months of DOAC) but CTA negative for VTE on 5/22 in ED. No significant new findings on initial ED work, consulted for admission given return and O2 reqt for minimal ambulation/exertion. Not hypoxic at rest currently.     In my discussion with patient, he reports that this buildup has been over probably 2 months now but significantly worsened within the past couple of weeks.  Prior to this, he was ambulating, moving life  without significant/similar issues.  Only other time he experienced anything like this was when he had the bilateral PEs, but those symptoms have improved as well.  Says that even if he tries to exert himself, getting out of the car and walking a block now is problematic and requires him taking a break due to dyspnea on exertion.  Has associated shortness of breath, brief palpitations, but denies any chest pain. No reported smoking history. No clear exposures otherwise reported.     Given this history, it seems likely he has undiagnosed pulmonary disease +/- cardiac.  Could be pulmonary hypertension related to his prior PEs, though CTA did not comment on changes consistent with this. Could be also be undiagnosed reactive vs obstructive lung disease.  Seems to be responsive to typical COPD interventions, will manage as such and obtain additional workup that includes TTE.  Will keep on telemetry given his palpitations and other symptoms that could warrant an arrhythmia underlying, though none has been captured today.  Continue to monitor closely, will consult if/when needed.     Suspect low back pain is mechanical, as it is acute on chronic but he was in the ER bed for several hours which often leads to these acute flareups on my evaluation.  Will continue to monitor closely, pain medication ordered PRN on admit while obtaining additional workup    * Additional details of management can be found throughout EMR.     Chronic, stable conditions: All prior medications reviewed extensively and updated within the patient's most current medication list to the best of my ability at time of admission. Will tentatively continue all other home medications for chronic conditions-- without any acute contraindications (from list updated below.)     *VTE prophylaxis ordered, unless otherwise contraindicated. Additional details available via EMR.     *As is my typical documentation style, an integrative HPI has been provided above  the impression & plan. The active problem list has been updated to reflect the problems being addressed, though this may all be integrated under the assessment and plan. For every admission, I take the time to update the PMH, active problem list, and medication list to the best of my ability. For the physical exam, I address the most pertinent systems findings: unless otherwise stated, other systems were either normal or did not require extensive evaluation/documentation. This document has been produced using "Dragon" computerized transcription. Therefore, although this document has been proofread to the best of my ability, errors/typos may persist.     Care plan and relevant information     CODE STATUS   Full Code     Baseline functional status Independent   Surrogate decision maker:  Extended Emergency Contact Information  Primary Emergency Contact: Gammell,Linell   [place]  Home Phone: [phone]  Relation: Other   Miscellaneous   Inpatient  Payor: [employer] MEDICARE / Plan: [facility] / Product Type: *No Product type* /   No active isolations No active infections   Discharge Plan:  Anticipated LOS: ~ 2-3 days  Anticipated disposition: home        Past Medical History:   Diagnosis Date    Alcohol use with alcohol-induced mood disorder (HCC) 02/07/2024    Degenerative joint disease of both hips     Hypertension     Multilevel degenerative disc disease     PE (pulmonary thromboembolism) (HCC) 2022    DVT/PE - treated with eliquis for 3 mos     *problem list added below, as it can be disruptive to patient's outpatient providers documentation to transfer all of the other diagnosis manually. Instead, there are diagnosis of importance listed here that may also be a part of patient's PMHx. CareEverywhere also reviewed and I manually pull over OSH diagnosis and add to the PMHx.   Patient Active Problem List   Diagnosis    NSTEMI (non-ST elevated myocardial infarction) (HCC)    Heart  palpitations    Exertional dyspnea    Acute respiratory failure with hypoxia (HCC)      Past Surgical History:   Procedure Laterality Date    ORTHOPEDIC SURGERY      left knee for torn ligament       Social History     Tobacco Use    Smoking status: Never    Smokeless tobacco: Never   Substance Use Topics    Alcohol use: Yes      History reviewed. No pertinent family history.   Relevant ROS: unremarkable unless previously stated     Objective Data (use drop down arrow for data / physical exam)     Objective     Patient Vitals for the past 24 hrs:   BP Temp Temp src Pulse Resp SpO2  Height Weight   02/07/24 1230 (!) 143/98 -- -- 82 19 100 % -- --   02/07/24 1215 139/81 -- -- 66 11 96 % -- --   02/07/24 1200 (!) 149/85 -- -- 68 18 97 % -- --   02/07/24 1145 -- -- -- 69 18 95 % -- --   02/07/24 1130 -- -- -- 71 14 98 % -- --   02/07/24 1045 (!) 175/99 -- -- 77 17 96 % -- --   02/07/24 1033 (!) 157/96 98.2 F (36.8 C) Oral 73 18 96 % 1.854 m (6\' 1" ) 104.3 kg (230 lb)     Body mass index is 30.34 kg/m.  Wt Readings from Last 10 Encounters:   02/07/24 104.3 kg (230 lb)   02/05/24 104.3 kg (230 lb)   12/20/23 99.8 kg (220 lb)   08/09/23 99.8 kg (220 lb)   05/04/23 99.8 kg (220 lb)      No intake/output data recorded.  No intake/output data recorded.        Physical Exam  Constitutional:       General: He is not in acute distress.     Appearance: He is ill-appearing. He is not toxic-appearing.   HENT:      Head: Normocephalic and atraumatic.      Mouth/Throat:      Mouth: Mucous membranes are dry.   Eyes:      Extraocular Movements: Extraocular movements intact.      Pupils: Pupils are equal, round, and reactive to light.   Cardiovascular:      Rate and Rhythm: Normal rate and regular rhythm.      Heart sounds: No murmur heard.     No friction rub. No gallop.      Comments: No appreciable JVD.  Pulmonary:      Comments: Normal work of breathing on room air at rest.  Slightly diminished air movement to the lower lung fields  though present.  Minimal wheezes, no rales, rhonchi, or crackles.   Abdominal:      General: There is no distension.      Palpations: Abdomen is soft.      Tenderness: There is no abdominal tenderness.   Musculoskeletal:         General: Tenderness (low back) present.      Cervical back: Normal range of motion and neck supple. No rigidity.      Right lower leg: No edema.      Left lower leg: No edema.   Lymphadenopathy:      Cervical: No cervical adenopathy.   Skin:     General: Skin is warm and dry.      Coloration: Skin is pale.   Neurological:      General: No focal deficit present.      Mental Status: He is alert and oriented to person, place, and time. Mental status is at baseline.   Psychiatric:         Mood and Affect: Mood normal.         Behavior: Behavior normal.      *Additional relevant details for exam may be found integrated above.      Medications (Prior to Admission + Current Inpatient)   Prior to Admission medications    Medication Sig Start Date End Date Taking? Authorizing Provider   albuterol  sulfate HFA (VENTOLIN  HFA) 108 (90 Base) MCG/ACT inhaler Inhale 2 puffs into the lungs 4 times daily as needed for Wheezing 02/05/24   Venditti,  Caesar Caster, MD   naproxen  (NAPROSYN ) 500 MG tablet Take 1 tablet by mouth 2 times daily 12/20/23   Margy Shin, MD   predniSONE  10 MG (21) TBPK Follow instructions on package  Patient not taking: Reported on 12/19/2023 05/04/23   Danetta Dunnings, APRN - NP   diclofenac  sodium (VOLTAREN ) 1 % GEL Apply 4 g topically 4 times daily as needed for Pain  Patient not taking: Reported on 12/19/2023 05/04/23   Danetta Dunnings, APRN - NP     *Updated, as best possible, on admission. Will continue to re-evaluate throughout admission and adjust accordingly.       ondansetron (ZOFRAN) injection 4 mg    acetaminophen (TYLENOL) tablet 650 mg    sodium chloride flush 0.9 % injection 5-40 mL    sodium chloride flush 0.9 % injection 5-40 mL    0.9 % sodium chloride infusion     enoxaparin Sodium (LOVENOX) injection 30 mg    ipratropium 0.5 mg-albuterol  2.5 mg (DUONEB) nebulizer solution 1 Dose    [START ON 02/08/2024] methylPREDNISolone sodium succ (SOLU-MEDROL) 40 mg in sterile water 1 mL injection **FOLLOWED BY** [START ON 02/10/2024] predniSONE  (DELTASONE ) tablet 40 mg    azithromycin (ZITHROMAX) tablet 500 mg    benzonatate (TESSALON) capsule 100 mg    methylPREDNISolone sodium succ (SOLU-MEDROL) 60 mg in sterile water 0.96 mL injection    sodium chloride flush 0.9 % injection 5-40 mL    0.9 % sodium chloride infusion    potassium chloride (KLOR-CON M) extended release tablet 40 mEq **OR** potassium bicarb-citric acid (EFFER-K) effervescent tablet 40 mEq **OR** potassium chloride 10 mEq/100 mL IVPB (Peripheral Line)    magnesium sulfate 2000 mg in 50 mL IVPB premix    melatonin tablet 3 mg    polyethylene glycol (GLYCOLAX) packet 17 g    acetaminophen (TYLENOL) tablet 650 mg **OR** acetaminophen (TYLENOL) suppository 650 mg    albuterol  sulfate HFA (VENTOLIN  HFA) 108 (90 Base) MCG/ACT inhaler    naproxen  (NAPROSYN ) 500 MG tablet    predniSONE  10 MG (21) TBPK    diclofenac  sodium (VOLTAREN ) 1 % GEL     Pertinent Data   Recent Labs     02/05/24  1529 02/07/24  1037   WBC 6.0 5.7   HGB 14.7 13.9   HCT 43.9 41.3   PLT 281 255     Recent Labs     02/05/24  1529 02/07/24  1037   NA 135* 137   K 3.6 3.6   CL 103 103   CO2 28 20*   BUN 14 12   MG 1.9 1.9   ALT 18 17      No results found for: "TSH"  Lab Results   Component Value Date    TROPHS 11 02/07/2024    TROPHS 10 02/05/2024    TROPHS 10 02/05/2024    BNP 130 (H) 06/01/2020    DDIMER 0.92 (H) 02/22/2021    LABA1C 5.6 06/01/2020       Micro  Results       Procedure Component Value Units Date/Time    COVID-19 & Influenza Combo [[id number]] Collected: 02/05/24 1529    Order Status: Completed Specimen: Nasopharyngeal Updated: 02/05/24 1617     Source Nasopharyngeal        SARS-CoV-2, PCR Not detected        Comment: Not Detected results do not  preclude SARS-CoV-2 infection and should not be used as the sole basis for patient management  decisions.Results must be combined with clinical observations, patient history, and epidemiological information.        Rapid Influenza A By PCR Not detected        Rapid Influenza B By PCR Not detected        Comment:    Testing was performed using [employer] Liat SARS-CoV-2 and Influenza A/B nucleic acid assay.  This test is a multiplex Real-Time Reverse Transcriptase Polymerase Chain Reaction (RT-PCR) based in vitro diagnostic test intended for the qualitative detection of nucleic acids from SARS-CoV-2, Influenza A, and Influenza B in nasopharyngeal specimens.               Labs Reviewed   CBC WITH AUTO DIFFERENTIAL - Abnormal; Notable for the following components:       Result Value    Neutrophils % 76.3 (*)     All other components within normal limits   COMPREHENSIVE METABOLIC PANEL - Abnormal; Notable for the following components:    CO2 20 (*)     Anion Gap 14 (*)     Total Bilirubin 1.4 (*)     Alk Phosphatase 124 (*)     All other components within normal limits   MAGNESIUM   TROPONIN   EXTRA TUBES HOLD   MAGNESIUM   PHOSPHORUS   C-REACTIVE PROTEIN   BRAIN NATRIURETIC PEPTIDE   VITAMIN D 25 HYDROXY   THYROID CASCADE PROFILE   PROCALCITONIN   URINALYSIS WITH MICROSCOPIC   D-DIMER, QUANTITATIVE   VENOUS BLOOD GAS, POINT OF CARE   VENOUS BLOOD GAS, POINT OF CARE     Results for orders placed or performed during the hospital encounter of 02/07/24   XR CHEST (2 VW)    Narrative    EXAM: XR CHEST (2 VW)  ACC#: [id number]     INDICATION: SOB, []      COMPARISON: Chest radiograph June 01, 2020    FINDINGS: PA and lateral radiographs of the chest. The lungs are hyperinflated.  The heart size is normal. Interstitial opacities are again seen in the lower  lobes. No new lung infiltrate is identified. The vascular clarity is normal.  There is no evidence of effusion or pneumothorax.      Impression    1. No acute  cardiopulmonary findings. Lower lung interstitial changes are again  noted.        Electronically signed by Vina Greaves   CBC with Auto Differential   Result Value Ref Range    WBC 5.7 4.1 - 11.1 K/uL    RBC 4.52 4.10 - 5.70 M/uL    Hemoglobin 13.9 12.1 - 17.0 g/dL    Hematocrit 56.4 33.2 - 50.3 %    MCV 91.4 80.0 - 99.0 FL    MCH 30.8 26.0 - 34.0 PG    MCHC 33.7 30.0 - 36.5 g/dL    RDW 95.1 88.4 - 16.6 %    Platelets 255 150 - 400 K/uL    MPV 9.3 8.9 - 12.9 FL    Nucleated RBCs 0.0 0 PER 100 WBC    nRBC 0.00 0.00 - 0.01 K/uL    Neutrophils % 76.3 (H) 32.0 - 75.0 %    Lymphocytes % 16.6 12.0 - 49.0 %    Monocytes % 6.2 5.0 - 13.0 %    Eosinophils % 0.2 0.0 - 7.0 %    Basophils % 0.5 0.0 - 1.0 %    Immature Granulocytes % 0.2 0.0 - 0.5 %    Neutrophils Absolute 4.32  1.80 - 8.00 K/UL    Lymphocytes Absolute 0.94 0.80 - 3.50 K/UL    Monocytes Absolute 0.35 0.00 - 1.00 K/UL    Eosinophils Absolute 0.01 0.00 - 0.40 K/UL    Basophils Absolute 0.03 0.00 - 0.10 K/UL    Immature Granulocytes Absolute 0.01 0.00 - 0.04 K/UL    Differential Type AUTOMATED     Comprehensive Metabolic Panel   Result Value Ref Range    Sodium 137 136 - 145 mmol/L    Potassium 3.6 3.5 - 5.1 mmol/L    Chloride 103 97 - 108 mmol/L    CO2 20 (L) 21 - 32 mmol/L    Anion Gap 14 (H) 2 - 12 mmol/L    Glucose 78 65 - 100 mg/dL    BUN 12 6 - 20 MG/DL    Creatinine 1.61 0.96 - 1.30 MG/DL    BUN/Creatinine Ratio 14 12 - 20      Est, Glom Filt Rate >90 >60 ml/min/1.42m2    Calcium 9.6 8.5 - 10.1 MG/DL    Total Bilirubin 1.4 (H) 0.2 - 1.0 MG/DL    ALT 17 12 - 78 U/L    AST 17 15 - 37 U/L    Alk Phosphatase 124 (H) 45 - 117 U/L    Total Protein 7.8 6.4 - 8.2 g/dL    Albumin 4.0 3.5 - 5.0 g/dL    Globulin 3.8 2.0 - 4.0 g/dL    Albumin/Globulin Ratio 1.1 1.1 - 2.2     Magnesium   Result Value Ref Range    Magnesium 1.9 1.6 - 2.4 mg/dL   Troponin   Result Value Ref Range    Troponin, High Sensitivity 11 0 - 76 ng/L   Extra Tubes Hold   Result Value Ref Range     Specimen HOld RED,LT GRN,BLU     Comment:        Add-on orders for these samples will be processed based on acceptable specimen integrity and analyte stability, which may vary by analyte.   EKG 12 Lead   Result Value Ref Range    Ventricular Rate 70 BPM    Atrial Rate 70 BPM    P-R Interval 136 ms    QRS Duration 96 ms    Q-T Interval 404 ms    QTc Calculation (Bazett) 436 ms    P Axis 87 degrees    R Axis 86 degrees    T Axis 85 degrees    Diagnosis       Normal sinus rhythm  Normal ECG  When compared with ECG of 05-Feb-2024 17:27,  No significant change was found    independently interpreted and  Confirmed by Catlett MD, Katharine ([id number]) on 02/07/2024 11:08:50 AM         Pending Labs on Admit  Active Lab Orders   Lab    Brain Natriuretic Peptide     Frequency: 1 Time     Number of Occurrences: 1 Occurrences    C-Reactive Protein     Frequency: 1 Time     Number of Occurrences: 1 Occurrences    CBC with Auto Differential     Frequency: Daily     Number of Occurrences: 2 Days    Comprehensive Metabolic Panel     Frequency: Daily     Number of Occurrences: 2 Days    D-Dimer, Quantitative     Frequency: 1 Time     Number of Occurrences: 1 Occurrences    Magnesium  Frequency: 1 Time     Number of Occurrences: 1 Occurrences    Magnesium     Frequency: Daily     Number of Occurrences: 2 Days    Phosphorus     Frequency: 1 Time     Number of Occurrences: 1 Occurrences    Procalcitonin     Frequency: 1 Time     Number of Occurrences: 1 Occurrences    Thyroid Cascade Profile     Frequency: 1 Time     Number of Occurrences: 1 Occurrences    Urinalysis with Microscopic     Frequency: 1 Time     Number of Occurrences: 1 Occurrences    Vitamin D 25 Hydroxy     Frequency: 1 Time     Number of Occurrences: 1 Occurrences            Certification: I am admitting Gerhart Ruggieri 64 y.o. male with a principle diagnosis of Acute respiratory failure with hypoxia (HCC). This patient also suffers from other comorbidities listed above. I  have a high level of concern for the active problems (see above) leading to life threatening complications or multi system organ failure if left untreated. Patient admitted under Inpatient status.   MDM:I have independently interviewed and examined the patient. I have reviewed the pertinent notes (including outside documentation,) laboratory tests, vitals, EKG (if applicable,) radiographic data, and all relevant data in order to formulate the plan of care (within the constraints of this admission process. these and other pertinent data were taken into consideration when the treatment plan was developed and customized to this patient's unique overall circumstances and needsI have reviewed at least 3 distinct labs ordered by myself or another provider. I have ordered the appropriate AM labs, which can be found throughout the EMR orders. I've created/updated an active problem list and updated the PMH (see above.) I've reconciled meds against available data and reporting. I've ordered follow-up labs if appropriate. I provided initial recommendations prior to transfer, spoke directly with ED provider. If applicable, a sign out will be completed to the assigned daytime hospital medicine provider, who will continue to follow with this patient.   Complexity: High   Time Spent: 60 minutes     Kalilah Barua D. Felipe Horton, MD MPH // Hospital Medicine  02/07/24

## 2024-02-07 NOTE — ED Notes (Signed)
 Pt ambulatory with baseline cane on RA. Standby assist by this LPN. Steady gait. Pt SpO2 dropped to 78% on RA while ambulating. Pt sat back down in bed and was able to bring SpO2 up to 98% on RA without needing any O2. Catlett MD notified.

## 2024-02-07 NOTE — ED Notes (Signed)
Pt changed into hospital gown at this time.

## 2024-02-07 NOTE — ED Provider Notes (Signed)
 [facility]  EMERGENCY DEPARTMENT ENCOUNTER       Pt Name: Ronald Kennedy  MRN: [id number]  Birthdate 05-08-1961  Date of evaluation: 02/07/2024  Provider: Carroll Clamp, MD   PCP: No primary care provider on file.  Note Started: 12:03 PM EDT 02/07/24     CHIEF COMPLAINT       Chief Complaint   Patient presents with    Chest Pain    Shortness of Breath    Dizziness        HISTORY OF PRESENT ILLNESS: 1 or more elements      History From: patient, History limited by: none     Ronald Kennedy is a 63 y.o. male who presents with a cc of dyspnea on exertion.       Please See MDM for Additional Details of the HPI/PMH  Nursing Notes were all reviewed and agreed with or any disagreements were addressed in the HPI.     REVIEW OF SYSTEMS        Positives and Pertinent negatives as per HPI.    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 No pertinent family history.    Social History:  Social History     Tobacco Use    Smoking status: Never    Smokeless tobacco: Never   Substance Use Topics    Alcohol use: Yes    Drug use: Never       Allergies:  No Known Allergies    CURRENT MEDICATIONS      Current Discharge Medication List        CONTINUE these medications which have NOT CHANGED    Details   albuterol  sulfate HFA (VENTOLIN  HFA) 108 (90 Base) MCG/ACT inhaler Inhale 2 puffs into the lungs 4 times daily as needed for Wheezing  Qty: 8 g, Refills: 0      naproxen  (NAPROSYN ) 500 MG tablet Take 1 tablet by mouth 2 times daily  Qty: 60 tablet, Refills: 0      predniSONE  10  MG (21) TBPK Follow instructions on package  Qty: 1 each, Refills: 0      diclofenac  sodium (VOLTAREN ) 1 % GEL Apply 4 g topically 4 times daily as needed for Pain  Qty: 150 g, Refills: 0             SCREENINGS               No data recorded            PHYSICAL EXAM      ED Triage Vitals [02/07/24 1033]   Encounter Vitals Group      BP (!) 157/96      Systolic BP Percentile       Diastolic BP Percentile       Pulse 73      Respirations 18      Temp 98.2  F (36.8 C)      Temp Source Oral      SpO2 96 %      Weight - Scale 104.3 kg (230 lb)      Height 1.854 m (6\' 1" )      Head Circumference       Peak Flow       Pain Score       Pain Loc       Pain Education       Exclude from Growth Chart         Physical Exam  Constitutional:       General: He is not in acute distress.     Appearance: Normal appearance. He is not ill-appearing.   HENT:      Head: Normocephalic and atraumatic.   Eyes:      Extraocular Movements: Extraocular movements intact.      Pupils: Pupils are equal, round, and reactive to light.   Cardiovascular:      Rate and Rhythm: Normal rate and regular rhythm.   Pulmonary:      Effort: Pulmonary effort is normal. No respiratory distress.      Breath sounds: Normal breath sounds. No wheezing or rhonchi.   Abdominal:      General: Abdomen is flat. There is no distension.      Palpations: Abdomen is soft.      Tenderness: There is no abdominal tenderness.   Musculoskeletal:         General: Normal range of motion.      Cervical back: Normal range of motion.   Skin:     General: Skin is warm and dry.   Neurological:      General: No focal deficit present.      Mental Status: He is alert and oriented to person, place, and time.   Psychiatric:         Mood and Affect: Mood normal.         Behavior: Behavior normal.          DIAGNOSTIC RESULTS   LABS:     Recent Results (from the past 24 hours)   EKG 12 Lead    Collection Time: 02/07/24 10:34 AM   Result Value Ref Range    Ventricular Rate 70 BPM    Atrial  Rate 70 BPM    P-R Interval 136 ms    QRS Duration 96 ms    Q-T Interval 404 ms    QTc Calculation (Bazett) 436 ms    P Axis 87 degrees    R Axis 86 degrees    T Axis 85 degrees    Diagnosis       Normal sinus rhythm  Normal ECG  When compared with ECG of 05-Feb-2024 17:27,  No significant change was found    independently interpreted and  Confirmed by Annica Marinello MD, Hilarie Lovely ([id number]) on 02/07/2024 11:08:50 AM     CBC with Auto Differential    Collection Time: 02/07/24 10:37 AM   Result Value Ref Range    WBC 5.7 4.1 - 11.1 K/uL    RBC 4.52 4.10 - 5.70 M/uL    Hemoglobin 13.9 12.1 - 17.0 g/dL    Hematocrit 60.4 54.0 - 50.3 %    MCV 91.4 80.0 - 99.0 FL    MCH 30.8 26.0 - 34.0 PG    MCHC 33.7 30.0 - 36.5 g/dL    RDW 98.1 19.1 - 47.8 %    Platelets 255 150 -  400 K/uL    MPV 9.3 8.9 - 12.9 FL    Nucleated RBCs 0.0 0 PER 100 WBC    nRBC 0.00 0.00 - 0.01 K/uL    Neutrophils % 76.3 (H) 32.0 - 75.0 %    Lymphocytes % 16.6 12.0 - 49.0 %    Monocytes % 6.2 5.0 - 13.0 %    Eosinophils % 0.2 0.0 - 7.0 %    Basophils % 0.5 0.0 - 1.0 %    Immature Granulocytes % 0.2 0.0 - 0.5 %    Neutrophils Absolute 4.32 1.80 - 8.00 K/UL    Lymphocytes Absolute 0.94 0.80 - 3.50 K/UL    Monocytes Absolute 0.35 0.00 - 1.00 K/UL    Eosinophils Absolute 0.01 0.00 - 0.40 K/UL    Basophils Absolute 0.03 0.00 - 0.10 K/UL    Immature Granulocytes Absolute 0.01 0.00 - 0.04 K/UL    Differential Type AUTOMATED     Comprehensive Metabolic Panel    Collection Time: 02/07/24 10:37 AM   Result Value Ref Range    Sodium 137 136 - 145 mmol/L    Potassium 3.6 3.5 - 5.1 mmol/L    Chloride 103 97 - 108 mmol/L    CO2 20 (L) 21 - 32 mmol/L    Anion Gap 14 (H) 2 - 12 mmol/L    Glucose 78 65 - 100 mg/dL    BUN 12 6 - 20 MG/DL    Creatinine 1.61 0.96 - 1.30 MG/DL    BUN/Creatinine Ratio 14 12 - 20      Est, Glom Filt Rate >90 >60 ml/min/1.89m2    Calcium 9.6 8.5 - 10.1 MG/DL    Total Bilirubin 1.4 (H) 0.2 - 1.0 MG/DL    ALT 17 12 - 78 U/L    AST 17 15 - 37 U/L    Alk  Phosphatase 124 (H) 45 - 117 U/L    Total Protein 7.8 6.4 - 8.2 g/dL    Albumin 4.0 3.5 - 5.0 g/dL    Globulin 3.8 2.0 - 4.0 g/dL    Albumin/Globulin Ratio 1.1 1.1 - 2.2     Magnesium    Collection Time: 02/07/24 10:37 AM   Result Value Ref Range    Magnesium 1.9 1.6 - 2.4 mg/dL   Troponin    Collection Time: 02/07/24 10:37 AM   Result Value Ref Range    Troponin, High Sensitivity 11 0 - 76 ng/L   Extra Tubes Hold    Collection Time: 02/07/24 10:37 AM   Result Value Ref Range    Specimen HOld RED,LT GRN,BLU     Comment:        Add-on orders for these samples will be processed based on acceptable specimen integrity and analyte stability, which may vary by analyte.   Brain Natriuretic Peptide    Collection Time: 02/07/24 10:37 AM   Result Value Ref Range    NT Pro-BNP 21 <125 PG/ML   D-Dimer, Quantitative    Collection Time: 02/07/24 10:37 AM   Result Value Ref Range    D-Dimer, Quant 3.16 (H) 0.00 - 0.65 mg/L FEU   Phosphorus    Collection Time: 02/07/24 10:37 AM   Result Value Ref Range    Phosphorus 2.8 2.6 - 4.7 MG/DL   Procalcitonin    Collection Time: 02/07/24 10:37 AM   Result Value Ref Range    Procalcitonin <0.05 ng/mL   Urinalysis with Microscopic    Collection Time: 02/07/24 12:48 PM   Result  Value Ref Range    Color, UA DARK YELLOW      Appearance CLEAR CLEAR      pH, Urine 5.5 5.0 - 8.0      Protein, UA 30 (A) NEG mg/dL    Glucose, Ur Negative NEG mg/dL    Ketones, Urine 80 (A) NEG mg/dL    Blood, Urine Negative NEG      Urobilinogen, Urine 2.0 (H) 0.2 - 1.0 EU/dL    Nitrite, Urine Negative NEG      Leukocyte Esterase, Urine Negative NEG      WBC, UA 0-4 0 - 4 /hpf    RBC, UA 0-5 0 - 5 /hpf    Epithelial Cells, UA MODERATE (A) FEW /lpf    BACTERIA, URINE Negative NEG /hpf    Hyaline Casts, UA 2-5 0 - 2 /lpf   Bilirubin, Confirmatory    Collection Time: 02/07/24 12:48 PM   Result Value Ref Range    Bilirubin Confirmation, UA Negative         EKG: If performed, independent interpretation documented below in  the MDM section  All EKGs independently interpreted by me.    RADIOLOGY:  Non-plain film images such as CT, Ultrasound and MRI are read by the radiologist. Plain radiographic images are visualized and preliminarily interpreted by the ED Provider with the findings documented in the MDM section.     Interpretation per the Radiologist below, if available at the time of this note:     XR CHEST (2 VW)   Final Result      1. No acute cardiopulmonary findings. Lower lung interstitial changes are again   noted.            Electronically signed by Vina Greaves           PROCEDURES   Unless otherwise noted below, none  Procedures     CRITICAL CARE TIME   none    EMERGENCY DEPARTMENT COURSE and DIFFERENTIAL DIAGNOSIS/MDM   Vitals:    Vitals:    02/07/24 1200 02/07/24 1215 02/07/24 1230 02/07/24 1335   BP: (!) 149/85 139/81 (!) 143/98 (!) 141/97   Pulse: 68 66 82 65   Resp: 18 11 19 16    Temp:    98.2 F (36.8 C)   TempSrc:    Oral   SpO2: 97% 96% 100% 99%   Weight:       Height:            Patient was given the following medications:  Medications   ondansetron (ZOFRAN) injection 4 mg (has no administration in time range)   acetaminophen (TYLENOL) tablet 650 mg (has no administration in time range)   sodium chloride flush 0.9 % injection 5-40 mL (has no administration in time range)   sodium chloride flush 0.9 % injection 5-40 mL (has no administration in time range)   0.9 % sodium chloride infusion (has no administration in time range)   enoxaparin Sodium (LOVENOX) injection 30 mg (has no administration in time range)   ipratropium 0.5 mg-albuterol  2.5 mg (DUONEB) nebulizer solution 1 Dose (has no administration in time range)   methylPREDNISolone sodium succ (SOLU-MEDROL) 40 mg in sterile water 1 mL injection (has no administration in time range)     Followed by   predniSONE  (DELTASONE ) tablet 40 mg (has no administration in time range)   azithromycin (ZITHROMAX) tablet 500 mg (has no administration in time range)    benzonatate (TESSALON) capsule 100 mg (has no administration  in time range)   methylPREDNISolone sodium succ (SOLU-MEDROL) 60 mg in sterile water 0.96 mL injection (has no administration in time range)   sodium chloride flush 0.9 % injection 5-40 mL (has no administration in time range)   0.9 % sodium chloride infusion (has no administration in time range)   potassium chloride (KLOR-CON M) extended release tablet 40 mEq (has no administration in time range)     Or   potassium bicarb-citric acid (EFFER-K) effervescent tablet 40 mEq (has no administration in time range)     Or   potassium chloride 10 mEq/100 mL IVPB (Peripheral Line) (has no administration in time range)   magnesium sulfate 2000 mg in 50 mL IVPB premix (has no administration in time range)   melatonin tablet 3 mg (has no administration in time range)   polyethylene glycol (GLYCOLAX) packet 17 g (has no administration in time range)   acetaminophen (TYLENOL) tablet 650 mg (has no administration in time range)     Or   acetaminophen (TYLENOL) suppository 650 mg (has no administration in time range)   oxyCODONE (ROXICODONE) immediate release tablet 5 mg (has no administration in time range)       Medical Decision Making  63 year old male with a history of hypertension, prior PE not on anticoagulation, who presents with a chief complaint of shortness of breath with exertion.  Patient was seen in this emergency department on 5/22 for the same symptoms.  At that time he had a negative workup including a negative CTA of his chest.  States his symptoms have persisted which prompted his trip back to the emergency department.  Symptoms include shortness of breath with exertion, intermittent chest pain and lightheadedness as well as palpitations.  None of his symptoms have changed since 5/22, they have simply not resolved.  Currently while at rest in the bed patient is symptom-free.    On exam patient is overall nontoxic-appearing, hemodynamically stable.   Speaking in full and complete sentences and satting normally on room air while seated in the bed.  Afebrile.  No unilateral leg swelling to suggest DVT.  Differential includes ACS, CHF, unlikely PE as patient had a negative CTA 2 days ago and symptoms have not significantly changed    Will check basic lab work to evaluate for evidence of severe electrolyte derangements or anemia.  Will check troponin and ekg to evaluate for evidence of ACS  Will check BNP to evaluate for evidence of fluid overload/CHF  Will check chest XR to evaluate for evidence of focal airspace disease/fluid overload/PTX      Problems Addressed:  Dyspnea on exertion: acute illness or injury  Hypoxemia: acute illness or injury    Amount and/or Complexity of Data Reviewed  Labs: ordered. Decision-making details documented in ED Course.  Radiology: ordered. Decision-making details documented in ED Course.  ECG/medicine tests: ordered and independent interpretation performed. Decision-making details documented in ED Course.    Risk  Decision regarding hospitalization.          CLINICAL MANAGEMENT TOOLS:  Not Applicable      ED Course as of 02/07/24 1443   Sat Feb 07, 2024   1155 Labs unremarkable  Patient desats to 9s on RA with ambulation [KC]      ED Course User Index  [KC] Kobee Medlen, Vernon Goodpasture, MD     Perfect Serve Consult for Admission 02/07/24   12:06 PM EDT    Room # 3235/01   Patient Name  Grove Defina  Date of Birth 1961/04/24   Medical Record Number  [id number]    Age  68 y.o.   Working diagnosis  1. Dyspnea on exertion    2. Hypoxemia    3. Shortness of breath         Clinical summary:   63 y.o. 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  Patient was seen in this morning department 2 days ago and had a full workup including a CTA which was negative.  Presents today for persistent symptoms.  Labs unremarkable.  X-ray negative.  Patient desats to 78% with ambulation  that was not hypoxic when sitting in bed.    Sepsis present:  No  Reassessment needed: No  Code Status:  Full Code  Readmission: No  Recommended Level of Care: telemetry  Zone Phone: [phone]    Requesting doctor/clinician: Carroll Clamp, MD           FINAL IMPRESSION     1. Dyspnea on exertion    2. Hypoxemia    3. Shortness of breath          DISPOSITION/PLAN       CLINICAL IMPRESSION    Admit Note: Pt is being admitted by Dr. Felipe Horton. The results of their tests and reason(s) for their admission have been discussed with pt and/or available family. They convey agreement and understanding for the need to be admitted and for the admission diagnosis.     I am the Primary Clinician of Record.   Trinaty Bundrick, MD (electronically signed)    (Please note that parts of this dictation were completed with voice recognition software. Quite often unanticipated grammatical, syntax, homophones, and other interpretive errors are inadvertently transcribed by the computer software. Please disregards these errors. Please excuse any errors that have escaped final proofreading.)         Britnie Colville S, MD  02/07/24 [phone]

## 2024-02-07 NOTE — Progress Notes (Signed)
 End of Shift Note    Bedside shift change report given to Fabian Holster RN (oncoming nurse) by Earna Go, RN (offgoing nurse).  Report included the following information SBAR, Kardex, and MAR    Shift worked:  7a-7p     Shift summary and any significant changes:    Patient admitted from the ED. Patient tolerated transfer/care well. Medications were given per the [facility]. Caring rounds have been completed. All admission assessments have been completed. Patient is able to walk with his cane, however oxygen needs to be monitored due to patients O2 dropping when they move. PRN oxycodone has been given x1. Patient has a patient supplied medication in the lock box in the ED med room. Nurse will need to go to security to get medication to give back to patient.       Activity:  Level of Assistance: Minimal assist, patient does 75% or more  Number times ambulated in hallways past shift: 0  Number of times OOB to chair past shift: 0    Cardiac:   Cardiac Monitoring: No           Access:  Current line(s): PIV     Genitourinary:        Respiratory:   O2 Device: None (Room air)  Chronic home O2 use?: NO  Incentive spirometer at bedside: NO    GI:     Current diet:  ADULT DIET; Regular  Passing flatus: YES    Pain Management:   Patient states pain is manageable on current regimen: YES    Skin:     Interventions: Wound Offloading (Prevention Methods): Pillows, Repositioning    Patient Safety:  Fall Risk: Nursing Judgement-Fall Risk High(Add Comments): Yes  Fall Risk Interventions  Nursing Judgement-Fall Risk High(Add Comments): Yes  Toilet Every 2 Hours-In Advance of Need: Yes  Hourly Visual Checks: In bed, Quiet  Fall Visual Posted: Socks, Armband, Fall sign posted  Room Door Open: Deferred to promote rest  Alarm On: Bed  Patient Moved Closer to Nursing Station: No    Active Consults:   IP CONSULT TO HOSPITALIST  IP CONSULT TO SOCIAL WORK    Length of Stay:  Expected LOS: 3  Actual LOS: 0    Earna Go, RN

## 2024-02-08 LAB — COMPREHENSIVE METABOLIC PANEL
ALT: 18 U/L (ref 12–78)
AST: 14 U/L — ABNORMAL LOW (ref 15–37)
Albumin/Globulin Ratio: 0.9 — ABNORMAL LOW (ref 1.1–2.2)
Albumin: 3.5 g/dL (ref 3.5–5.0)
Alk Phosphatase: 112 U/L (ref 45–117)
Anion Gap: 6 mmol/L (ref 2–12)
BUN/Creatinine Ratio: 20 (ref 12–20)
BUN: 16 mg/dL (ref 6–20)
CO2: 24 mmol/L (ref 21–32)
Calcium: 9.2 mg/dL (ref 8.5–10.1)
Chloride: 106 mmol/L (ref 97–108)
Creatinine: 0.8 mg/dL (ref 0.70–1.30)
Est, Glom Filt Rate: >90 mL/min/{1.73_m2} (ref >60)
Globulin: 4.1 g/dL — ABNORMAL HIGH (ref 2.0–4.0)
Glucose: 154 mg/dL — ABNORMAL HIGH (ref 65–100)
Potassium: 3.9 mmol/L (ref 3.5–5.1)
Sodium: 136 mmol/L (ref 136–145)
Total Bilirubin: 0.6 mg/dL (ref 0.2–1.0)
Total Protein: 7.6 g/dL (ref 6.4–8.2)

## 2024-02-08 LAB — MAGNESIUM: Magnesium: 2 mg/dL (ref 1.6–2.4)

## 2024-02-08 LAB — CBC WITH AUTO DIFFERENTIAL
Basophils %: 0 % (ref 0.0–1.0)
Basophils Absolute: 0 10*3/uL (ref 0.00–0.10)
Eosinophils %: 0 % (ref 0.0–7.0)
Eosinophils Absolute: 0 10*3/uL (ref 0.00–0.40)
Hematocrit: 38.1 % (ref 36.6–50.3)
Hemoglobin: 12.9 g/dL (ref 12.1–17.0)
Immature Granulocytes %: 0.3 % (ref 0.0–0.5)
Immature Granulocytes Absolute: 0.01 10*3/uL (ref 0.00–0.04)
Lymphocytes %: 12.1 % (ref 12.0–49.0)
Lymphocytes Absolute: 0.36 10*3/uL — ABNORMAL LOW (ref 0.80–3.50)
MCH: 30.4 pg (ref 26.0–34.0)
MCHC: 33.9 g/dL (ref 30.0–36.5)
MCV: 89.9 FL (ref 80.0–99.0)
MPV: 9.4 FL (ref 8.9–12.9)
Monocytes %: 1 % — ABNORMAL LOW (ref 5.0–13.0)
Monocytes Absolute: 0.03 10*3/uL (ref 0.00–1.00)
Neutrophils %: 86.6 % — ABNORMAL HIGH (ref 32.0–75.0)
Neutrophils Absolute: 2.6 10*3/uL (ref 1.80–8.00)
Nucleated RBCs: 0 /100{WBCs}
Platelets: 262 10*3/uL (ref 150–400)
RBC: 4.24 M/uL (ref 4.10–5.70)
RDW: 12.3 % (ref 11.5–14.5)
WBC: 3 10*3/uL — ABNORMAL LOW (ref 4.1–11.1)
nRBC: 0 10*3/uL (ref 0.00–0.01)

## 2024-02-08 LAB — VITAMIN D 25 HYDROXY: Vit D, 25-Hydroxy: <9 ng/mL — ABNORMAL LOW (ref 30–100)

## 2024-02-08 MED ORDER — IPRATROPIUM-ALBUTEROL 0.5-2.5 (3) MG/3ML IN SOLN
0.5-2.5 | Freq: Two times a day (BID) | RESPIRATORY_TRACT | Status: DC
Start: 2024-02-08 — End: 2024-02-10
  Administered 2024-02-09 – 2024-02-10 (×4): 1 via RESPIRATORY_TRACT

## 2024-02-08 MED FILL — METHYLPREDNISOLONE SODIUM SUCC 40 MG IJ SOLR: 40 MG | INTRAMUSCULAR | Qty: 40 | Fill #0

## 2024-02-08 MED FILL — IPRATROPIUM-ALBUTEROL 0.5-2.5 (3) MG/3ML IN SOLN: 0.5-2.5 (3) MG/3ML | RESPIRATORY_TRACT | Qty: 3 | Fill #0

## 2024-02-08 MED FILL — ENOXAPARIN SODIUM 30 MG/0.3ML IJ SOSY: 30 MG/0.3ML | INTRAMUSCULAR | Qty: 0.3 | Fill #0

## 2024-02-08 MED FILL — AZITHROMYCIN 250 MG PO TABS: 250 MG | ORAL | Qty: 2 | Fill #0

## 2024-02-08 NOTE — Progress Notes (Signed)
 ADULT PROTOCOL: JET AEROSOL ASSESSMENT    Patient  Ronald Kennedy     63 y.o.   male     02/08/2024  9:48 AM    Breath Sounds Pre Procedure: Breath Sounds Pre-Tx LUL: Clear, Diminished                                  Breath Sounds Pre-Tx LLL: Clear, Diminished        Breath Sounds Pre-Tx RUL: Clear, Diminished        Breath Sounds Pre-Tx RML: Clear, Diminished        Breath Sounds Pre-Tx RLL: Clear, Diminished  Breath Sounds Post Procedure: Breath Sounds Post-Tx LUL: Diminished, Clear          Breath Sounds Post-Tx LLL: Diminished, Clear          Breath Sounds Post-Tx RUL: Diminished, Clear          Breath Sounds Post-Tx RML: Diminished, Crackles          Breath Sounds Post-Tx RLL: Diminished, Clear                                     Heart Rate: Pre procedure Pre-Tx Pulse: 58           Post procedure Post-Tx Pulse: 74    Resp Rate: Pre procedure Pre-Tx Resps: 16           Post procedure Post-Tx Resps: 20      Oxygen: O2 Therapy: Room air       SpO2:  SpO2: 99 %   without Oxygen                Nebulizer Therapy: Current medications Medications: Albuterol /Ipratropium      Changed: Yes BID        Problem List:   Patient Active Problem List   Diagnosis    NSTEMI (non-ST elevated myocardial infarction) (HCC)    Heart palpitations    Exertional dyspnea    Acute respiratory failure with hypoxia (HCC)    Acute on chronic low back pain       Respiratory Therapist: Andree Bane, RT

## 2024-02-08 NOTE — Progress Notes (Signed)
 Hospitalist Progress Note    NAME:   Ronald Kennedy   DOB: [birth year]   MRN: [id number]     Date/Time: 02/08/2024 1:21 PM  Patient PCP: No primary care provider on file.    Estimated discharge date:  5/26  Barriers: Echo, cardiac, pulm cleared      Assessment / Plan:  Dyspnea on exertion  Cardiac versus pulmonary causes  History of smoking  Undiagnosed COPD?  Anginal chest pain  History of PE, completed 3 months of DOAC  Repeat 5/22 CTA negative for PE  6-minute walk test before discharge  Follow-up with echo  Cardiac consult  EKG not not done, ordered again, troponin negative  Azithromycin 3 days for anti-inflammatory effect and prednisone   No history of DM or HTN  Monitor blood pressure    Medical Decision Making:   I personally reviewed labs: Yes CBC, BMP  I personally reviewed imaging: Yes  I personally reviewed EKG: Yes  Toxic drug monitoring: Yes  Discussed case with: Yes        Code Status: Full code  DVT Prophylaxis: Lovenox  GI Prophylaxis:    Subjective:     Chief Complaint / Reason for Physician Visit  "Seen in telemetry floor, on room air, has no shortness of breath now or chest pain but he experience shortness of breath with moderate exertion".  Discussed with RN events overnight.       Objective:     VITALS:   Last 24hrs VS reviewed since prior progress note. Most recent are:  Patient Vitals for the past 24 hrs:   BP Temp Temp src Pulse Resp SpO2   02/08/24 0944 -- -- -- -- -- 99 %   02/08/24 0807 123/86 97.7 F (36.5 C) Oral 54 16 99 %   02/08/24 0345 (!) 143/98 97.9 F (36.6 C) -- 62 18 99 %   02/07/24 2345 122/77 98.4 F (36.9 C) -- 66 18 96 %   02/07/24 2107 -- -- -- 74 20 98 %   02/07/24 2049 -- -- -- 62 20 95 %   02/07/24 1937 130/81 98.1 F (36.7 C) -- 74 20 100 %   02/07/24 1714 -- -- -- -- -- 99 %   02/07/24 1631 -- -- -- -- 16 --   02/07/24 1520 128/81 98.7 F (37.1 C) Oral 63 18 100 %   02/07/24 1335 (!) 141/97 98.2 F (36.8 C) Oral 65 16 99 %         Intake/Output Summary (Last 24  hours) at 02/08/2024 1321  Last data filed at 02/08/2024 0650  Gross per 24 hour   Intake --   Output 950 ml   Net -950 ml        I had a face to face encounter and independently examined this patient on 02/08/2024, as outlined below:  PHYSICAL EXAM:  General: Alert, cooperative  EENT:  EOMI. Anicteric sclerae.  Resp:  CTA bilaterally, no wheezing or rales.  No accessory muscle use  CV:  Regular  rhythm,  No edema  GI:  Soft, Non distended, Non tender.  +Bowel sounds  Neurologic:  Alert and oriented X 3, normal speech,   Psych:   Good insight. Not anxious nor agitated  Skin:  No rashes.  No jaundice    Reviewed most current lab test results and cultures  YES  Reviewed most current radiology test results   YES  Review and summation of old records today    NO  Reviewed  patient's current orders and MAR    YES  PMH/SH reviewed - no change compared to H&P    Procedures: see electronic medical records for all procedures/Xrays and details which were not copied into this note but were reviewed prior to creation of Plan.      LABS:  I reviewed today's most current labs and imaging studies.  Pertinent labs include:  Recent Labs     02/05/24  1529 02/07/24  1037 02/08/24  0334   WBC 6.0 5.7 3.0*   HGB 14.7 13.9 12.9   HCT 43.9 41.3 38.1   PLT 281 255 262     Recent Labs     02/05/24  1529 02/07/24  1037 02/08/24  0334   NA 135* 137 136   K 3.6 3.6 3.9   CL 103 103 106   CO2 28 20* 24   GLUCOSE 92 78 154*   BUN 14 12 16    CREATININE 0.99 0.87 0.80   CALCIUM 9.3 9.6 9.2   MG 1.9 1.9 2.0   PHOS  --  2.8  --    BILITOT 1.1* 1.4* 0.6   AST 16 17 14*   ALT 18 17 18        Signed: Treasa Friend, MD

## 2024-02-08 NOTE — Progress Notes (Signed)
 End of Shift Note    Bedside shift change report given to Carolynne Citron RN (oncoming nurse) by Earna Go, RN (offgoing nurse).  Report included the following information SBAR, Kardex, and MAR    Shift worked:  7a-7p     Shift summary and any significant changes:    Patient tolerated care well. Medications were given per the [facility]. Caring rounds have been completed. Patient is a standby to the bathroom.        Activity:  Level of Assistance: Minimal assist, patient does 75% or more  Number times ambulated in hallways past shift: 0  Number of times OOB to chair past shift: 0    Cardiac:   Cardiac Monitoring: Yes           Access:  Current line(s): PIV     Genitourinary:        Respiratory:   O2 Device: None (Room air)  Chronic home O2 use?: NO  Incentive spirometer at bedside: NO    GI:  Last BM (including prior to admit): 02/05/24  Current diet:  ADULT DIET; Regular  Passing flatus: YES    Pain Management:   Patient states pain is manageable on current regimen: YES    Skin:  Braden Scale Score: 19  Interventions: Wound Offloading (Prevention Methods): Pillows, Repositioning, Turning    Patient Safety:  Fall Risk: Nursing Judgement-Fall Risk High(Add Comments): Yes  Fall Risk Interventions  Nursing Judgement-Fall Risk High(Add Comments): Yes  Toilet Every 2 Hours-In Advance of Need: Yes  Hourly Visual Checks: Quiet, In bed  Fall Visual Posted: Armband, Socks, Fall sign posted  Room Door Open: Deferred to decrease stimulation  Alarm On: Bed  Patient Moved Closer to Nursing Station: No    Active Consults:   IP CONSULT TO HOSPITALIST  IP CONSULT TO SOCIAL WORK    Length of Stay:  Expected LOS: 3  Actual LOS: 1    Earna Go, RN

## 2024-02-08 NOTE — Progress Notes (Signed)
 Spoke with Pulmonology about PFT order. They are aware the PFT lab is closed and are ok with the pt having the PFT out patient.

## 2024-02-08 NOTE — Plan of Care (Signed)
 Problem: Occupational Therapy - Adult  Goal: By Discharge: Performs self-care activities at highest level of function for planned discharge setting.  See evaluation for individualized goals.  Description: FUNCTIONAL STATUS PRIOR TO ADMISSION:  Patient is ambulatory with SPC at baseline. Uses R knee brace and LSO style back brace due to back pain and R knee instability. + fall history. Does not drive. Uses public bus for transportation. Independent for ADLs. Does not wear supplemental O2 at baseline.    , Prior Level of Assist for ADLs: Independent,  ,  ,  ,  ,  , Prior Level of Assist for Homemaking: Independent,  , Prior Level of Assist for Transfers: Independent, Active Driver: No     HOME SUPPORT: Patient lived alone w/ helpful neighbor close by.    Occupational Therapy Goals:  Initiated 02/08/2024  1.  Patient will perform grooming in stance with Independence within 7 day(s).  2.  Patient will perform upper body dressing with Independence within 7 day(s).  3.  Patient will perform lower body dressing with Independence within 7 day(s).  4.  Patient will perform toilet transfers with Modified Independence  within 7 day(s).  5.  Patient will perform all aspects of toileting with Independence within 7 day(s).  6.  Patient will participate in upper extremity therapeutic exercise/activities with Supervision for 7 minutes within 7 day(s).    7.  Patient will utilize energy conservation techniques during functional activities with verbal cues within 7 day(s).   02/08/2024 1349 by Dany Dyke, OT  Outcome: Progressing    OCCUPATIONAL THERAPY EVALUATION    Patient: Ronald Kennedy (63 y.o. male)  Date: 02/08/2024  Primary Diagnosis: Shortness of breath [R06.02]  Hypoxemia [R09.02]  Dyspnea on exertion [R06.09]  Acute respiratory failure with hypoxia (HCC) [J96.01]         Precautions: Fall Risk, Bed Alarm                  ASSESSMENT :  The patient is limited by decreased functional mobility, independence in ADLs,  high-level IADLs, strength, activity tolerance, endurance, safety awareness, coordination, balance, posture, increased pain levels, general weakness . Pt seen supine in bed, agreeable to therapy. Pt currently completing bed mobility and funcitonal transfers ranging from mod I to CGA (pt uses personal SPC during mobility). During mobility pt w/ antalgic gait which he contributes to lower back, bilateral hip, and R knee issues (pt reports doctors telling him about the benefits of surgery, but he declines medical advice). Attempted to educate pt on benefit of RW or rollator, pt not receptive to education. Pt on RA and O2 at 98% without activity, w/ increased time spent mobilizing and completing ADLs O2 dropped to 85% w/ associated SOB but quickly recovers to 93-94 ~20 seconds. Pt currently completing ADLs w/ up to min A required at this time. Will continue to follow pt.     Based on the impairments listed above the pt warrants further acute care OT services.    Functional Outcome Measure:  The patient scored 70/100 on the Barthel Index outcome measure        PLAN :  Recommendations and Planned Interventions:   self care training, therapeutic activities, functional mobility training, balance training, therapeutic exercise, endurance activities, patient education, and home safety training    Frequency/Duration: OT Plan of Care: 4 times/week    Recommendation for discharge: (in order for the patient to meet his/her long term goals):   Outpatient occupational therapy for balance training and  ADL modifications to assist w/ continued at home independence     Other factors to consider for discharge: lives alone, high risk for falls, concern for safely navigating or managing the home environment, and o2 dropping w/ increased ambulation    IF patient discharges home will need the following DME: shower chair       SUBJECTIVE:   Patient stated, "they told me about my knee, hips and back. I am not having surgery though"  OBJECTIVE  DATA SUMMARY:     Past Medical History:   Diagnosis Date    Alcohol use with alcohol-induced mood disorder (HCC) 02/07/2024    Degenerative joint disease of both hips     Hypertension     Multilevel degenerative disc disease     PE (pulmonary thromboembolism) (HCC) 2022    DVT/PE - treated with eliquis for 3 mos     Past Surgical History:   Procedure Laterality Date    ORTHOPEDIC SURGERY      left knee for torn ligament          Expanded or extensive additional review of patient history:   Social/Functional History  Lives With: Alone  Type of Home: Apartment (10 steps with R railing to second floor apartment)  Home Layout: One level  Bathroom Shower/Tub: Cabin crew: Grab bars in shower  Home Equipment: Jeananne Mighty  Has the patient had two or more falls in the past year or any fall with injury in the past year?: Yes  Prior Level of Assist for ADLs: Independent  Prior Level of Assist for Homemaking: Independent  Prior Level of Assist for Ambulation: Independent community ambulator, with or without device, Independent household ambulator, with or without device  Prior Level of Assist for Transfers: Surveyor, minerals: No  Mode of Transportation: Bus  Occupation: Retired      Higher education careers adviser Dominance: unknown     EXAMINATION OF PERFORMANCE DEFICITS:    Cognitive/Behavioral Status:  Orientation  Overall Orientation Status: Within Normal Limits  Cognition  Overall Cognitive Status: Exceptions  Arousal/Alertness: Appears intact  Following Commands: Appears intact  Attention Span: Appears intact  Memory: Appears intact  Safety Judgement: Decreased awareness of need for assistance;Decreased awareness of need for safety  Problem Solving: Appears intact  Insights: Decreased awareness of deficits  Initiation: Appears intact  Sequencing: Appears intact    Vision/Perceptual:    Vision - Basic Assessment  Patient Visual Report: No visual complaint reported.                     Range of Motion:   AROM: Generally  decreased, functional         Strength:  Strength: Generally decreased, functional      Coordination:  Coordination: Generally decreased, functional     Coordination: Within functional limits      Tone & Sensation:   Tone: Normal  Sensation: Intact          Functional Mobility and Transfers for ADLs:    Bed Mobility:     Bed Mobility Training  Bed Mobility Training: Yes  Overall Level of Assistance: Stand by assistance  Interventions: Verbal cues;Safety awareness training  Rolling: Stand by assistance (use of bed railing; HOB elevated)  Supine to Sit: Stand by assistance (use of bed railing; HOB elevated)  Scooting: Modified independent    Transfers:      Art therapist: Yes  Overall Level of Assistance: Contact guard assistance  Interventions:  Verbal cues;Safety awareness training  Sit to Stand: Contact guard assistance  Stand to Sit: Contact guard assistance  Bed to Chair: Contact guard assistance (with SPC)                     Balance:      Balance  Sitting: Impaired  Sitting - Static: Good (unsupported)  Sitting - Dynamic: Good (unsupported)  Standing: Impaired  Standing - Static: Good;Constant support  Standing - Dynamic: Fair;Constant support;Good      ADL Assessment:          Feeding: Independent       Grooming: Contact guard assistance  Grooming Skilled Clinical Factors: in stance at sink    UE Bathing: Contact guard assistance;Based on clinical judgement            LE Bathing: Minimal assistance;Based on clinical judgement       UE Dressing: Contact guard assistance       LE Dressing: Contact guard assistance  LE Dressing Skilled Clinical Factors: seated EOB    Toileting: Contact guard assistance                                                                                                                                                                                                                                           Barthel Index:    Barthel Index Scale  Feeding: Independent,  Able to apply any necessary device. Feeds in reasonable time  Bathing: Cannot perform activity  Grooming: Washes face, combs hair, brushes teeth, shaves (manages plug if electric razor)  Dressing: Needs help, but does at least half of task within reasonable time  Bowel Control: No accidents. Able to use enema or suppository if needed  Bladder Control: No accidents. Able to care for collecting device, if used  Toilet Transfers: Needs help for balance, handling clothes or toilet paper  Chair/Bed Trannsfers: Minimum assistance or supervision required  Ambulation: With help for 50 yards  Stairs: Needs help or supervision  Total Barthel Index Score: 70       The Barthel ADL Index: Guidelines  1. The index should be used as a record of what a patient does, not as a record of what a patient could do.  2. The main aim is to establish degree of independence from any help, physical or verbal, however minor and for whatever reason.  3. The need for supervision renders the patient not independent.  4. A patient's performance should be established using the best available evidence. Asking the patient, friends/relatives and nurses are the usual sources, but direct observation and common sense are also important. However direct testing is not needed.  5. Usually the patient's performance over the preceding 24-48 hours is important, but occasionally longer periods will be relevant.  6. Middle categories imply that the patient supplies over 50 per cent of the effort.  7. Use of aids to be independent is allowed.    Score Interpretation (from Sinoff 1997)   80-100 Independent   60-79 Minimally independent   40-59 Partially dependent   20-39 Very dependent   <20 Totally dependent     -Mahoney, F.l., Barthel, D.W. (1965). Functional evaluation: the Barthel Index. [place] [address] Med J (14)2.  -Sinoff, G., Ore, L. (1997). The Barthel activities of daily living index: self-reporting versus actual performance in the old (> or = 75 years).  Journal of American Geriatric Society 45(7), [phone].   -Dorenda Gandy Aldan, J.J.M.F, Arvilla Birmingham., Annabell Key. (1999). Measuring the change in disability after inpatient rehabilitation; comparison of the responsiveness of the Barthel Index and Functional Independence Measure. Journal of Neurology, Neurosurgery, and Psychiatry, 66(4), [phone].  Caro Christmas, N.J.A, [place],  W.J.M, & Koopmanschap, M.A. (2004) Assessment of post-stroke quality of life in cost-effectiveness studies: The usefulness of the Barthel Index and the EuroQoL-5D. Quality of Life Research, 13, 427-43                                                                                                                                                                                                                                 Pain Rating:  Back pain and R knee pain (did not quantify)   Pain Intervention(s):       Activity Tolerance:   Fair , observed shortness of breath on exertion, and desaturates on RA but does recover quickly    After treatment:   Patient left in no apparent distress sitting up in chair, Call bell within reach, and Bed/ chair alarm activated    COMMUNICATION/EDUCATION:   The patient's plan of care was discussed with: physical therapist and registered nurse         Thank you for this referral.  Dany Dyke, OT  Minutes: 28    Occupational Therapy Evaluation Charge Determination   History Examination Decision-Making  LOW Complexity : Brief history review  LOW Complexity: 1-3 Performance deficits relating to physical, cognitive, or psychosocial skills that result in activity limitations and/or participation restrictions LOW Complexity: No comorbidities that affect functional and  no verbal  or physical assist needed to complete eval tasks   Based on the above components, the patient evaluation is determined to be of the following complexity level: Low

## 2024-02-08 NOTE — Plan of Care (Signed)
 Problem: Physical Therapy - Adult  Goal: By Discharge: Performs mobility at highest level of function for planned discharge setting.  See evaluation for individualized goals.  Description: FUNCTIONAL STATUS PRIOR TO ADMISSION: Patient is ambulatory with SPC at baseline. Uses R knee brace and LSO style back brace due to back pain and R knee instability. + fall history. Does not drive. Uses public bus for transportation. Independent for ADLs. Does not wear supplemental O2 at baseline.     HOME SUPPORT PRIOR TO ADMISSION: The patient lived alone. Reported that he has "a guy," otherwise without support.    Physical Therapy Goals  Initiated 02/08/2024  1.  Patient will move from supine to sit and sit to supine, scoot up and down, and roll side to side in bed with modified independence within 7 day(s).    2.  Patient will perform sit to stand with modified independence within 7 day(s).  3.  Patient will transfer from bed to chair and chair to bed with modified independence using the least restrictive device within 7 day(s).  4.  Patient will ambulate with modified independence for 250 feet with the least restrictive device within 7 day(s).   5.  Patient will ascend/descend 10 stairs with 1 handrail(s) with modified independence within 7 day(s).   Outcome: Progressing   PHYSICAL THERAPY EVALUATION    Patient: Ronald Kennedy 63 y.o. male)  Date: 02/08/2024  Primary Diagnosis: Shortness of breath [R06.02]  Hypoxemia [R09.02]  Dyspnea on exertion [R06.09]  Acute respiratory failure with hypoxia (HCC) [J96.01]       Precautions: Restrictions/Precautions  Restrictions/Precautions: Fall Risk, Bed Alarm            ASSESSMENT :   DEFICITS/IMPAIRMENTS:   The patient is limited by decreased functional mobility, ROM, strength, body mechanics, activity tolerance, endurance, safety awareness, balance, posture, increased pain levels     Based on the impairments listed above, patient presents with decline from baseline mobility,  increased risk for falls, and decreased safety awareness following admission for acute respiratory failure. Received supine in bed and agreeable to PT evaluation. On RA for entire session with SpO2 93-98% at rest and with initial mobility, including supine>sit transfer, sit>stand transfer, and short distance ambulation in room to the bathroom. With longer distance ambulation in hallway, SpO2 88% on RA, however once returned to room post-ambulation, SpO2 85%. SpO2 very quickly (<20 seconds) recovers to 96-98% with rest. Patient with overall unsteady and unsafe mobility during transfers and gait training. Patient h/o back, hip, and knee issues, however has refused surgery in the past. Patient with tendency to cross his legs during sit>stand transfers and demonstrates uncontrolled descent into sitting. Exhibits marked antalgia during ambulation despite use of SPC. Attempted to educate patient on use of RW vs rollator to provide more support to back, hips, and knees, however patient not receptive to education. Provided patient with incentive spirometer and instructed on proper use. Pt was left sitting in recliner chair with all needs met, RN aware, and chair alarm on following session. Pt will continue to benefit from skilled PT to optimize functional independence.    Patient will benefit from skilled intervention to address the above impairments.    Functional Outcome Measure:  The patient scored 17/24 on the AMPAC mobility outcome measure which is indicative of higher likelihood of increased need for additional therapy services following discharge.           PLAN :  Recommendations and Planned Interventions:   bed mobility training,  transfer training, gait training, therapeutic exercises, patient and family training/education, and therapeutic activities    Frequency/Duration: Patient will be followed by physical therapy to address goals, PT Plan of Care: 4 times/week to address goals.    Recommendations for staff  mobility and toileting assistance:  Recommend that staff completes patient mobility with assist x1 using gait belt and cane. and Recommend toileting using  recommended toilet device: the bathroom with staff assist x1 using  gait belt and cane.    Recommend for next PT session: sit to stand transfers, bed to bedside chair transfers, stair training, further progression of gait with existing device, and monitor O2 saturations during activty    Recommendation for discharge: (in order for the patient to meet his/her long term goals):   Outpatient physical therapy for strengthening, gait training    Other factors to consider for discharge: lives alone and concern for safely navigating or managing the home environment    IF patient discharges home will need the following DME: continuing to assess with progress - recommended use of RW vs rollator, however patient adamantly refused education                SUBJECTIVE:   Patient stated "I'm not going to do that."    OBJECTIVE DATA SUMMARY:       Past Medical History:   Diagnosis Date    Alcohol use with alcohol-induced mood disorder (HCC) 02/07/2024    Degenerative joint disease of both hips     Hypertension     Multilevel degenerative disc disease     PE (pulmonary thromboembolism) (HCC) 2022    DVT/PE - treated with eliquis for 3 mos     Past Surgical History:   Procedure Laterality Date    ORTHOPEDIC SURGERY      left knee for torn ligament       Home Situation:  Social/Functional History  Lives With: Alone  Type of Home: Apartment (10 steps with R railing to second floor apartment)  Home Layout: One level  Bathroom Shower/Tub: Cabin crew: Grab bars in shower  Home Equipment: Jeananne Mighty  Has the patient had two or more falls in the past year or any fall with injury in the past year?: Yes  Prior Level of Assist for ADLs: Independent  Prior Level of Assist for Homemaking: Independent  Prior Level of Assist for Ambulation: Independent community ambulator,  with or without device, Independent household ambulator, with or without device  Prior Level of Assist for Transfers: Independent  Active Driver: No  Mode of Transportation: Bus  Occupation: Retired    Biomedical engineer Status:  Orientation  Overall Orientation Status: Within Normal Limits  Cognition  Overall Cognitive Status: Exceptions  Arousal/Alertness: Appears intact  Following Commands: Appears intact  Attention Span: Appears intact  Memory: Appears intact  Safety Judgement: Decreased awareness of need for assistance;Decreased awareness of need for safety  Problem Solving: Appears intact  Insights: Decreased awareness of deficits  Initiation: Appears intact  Sequencing: Appears intact    Skin: Intact    Edema: None    Hearing:        Vision/Perceptual:                      Strength:    Strength: Generally decreased, functional    Tone & Sensation:   Tone: Normal       Coordination:  Coordination: Within functional limits    Range Of Motion:  AROM: Generally  decreased, functional  PROM: Generally decreased, functional    Functional Mobility:  Bed Mobility:     Bed Mobility Training  Bed Mobility Training: Yes  Overall Level of Assistance: Stand by assistance  Interventions: Verbal cues;Safety awareness training  Rolling: Stand by assistance (use of bed railing; HOB elevated)  Supine to Sit: Stand by assistance (use of bed railing; HOB elevated)  Scooting: Modified independent  Transfers:     Art therapist: Yes  Overall Level of Assistance: Contact guard assistance  Interventions: Verbal cues;Safety awareness training  Sit to Stand: Contact guard assistance  Stand to Sit: Contact guard assistance  Bed to Chair: Contact guard assistance (with SPC)  Balance:               Balance  Sitting: Impaired  Sitting - Static: Good (unsupported)  Sitting - Dynamic: Good (unsupported)  Standing: Impaired  Standing - Static: Good;Constant support  Standing - Dynamic: Fair;Constant  support;Good  Ambulation/Gait Training:                       Gait  Gait Training: Yes  Overall Level of Assistance: Contact guard assistance  Distance (ft): 40 Feet (additional 180 ft following brief seated rest break on commode)  Assistive Device: Gait belt;Brace/splint;Cane, straight;Other (comment) (back and knee brace)  Interventions: Safety awareness training;Verbal cues  Base of Support: Center of gravity altered  Speed/Cadence: Pace decreased (< 100 feet/min)  Step Length: Right shortened;Left shortened  Gait Abnormalities: Antalgic;Decreased step clearance;Trunk sway increased                                                                                                                                                                                                                                                                [employer] AM-PAC      Basic Mobility Inpatient Short Form (6-Clicks) Version 2    How much help is needed turning from your back to your side while in a flat bed without using bedrails?: A Little  How much help is needed moving from lying on your back to sitting on the side of a flat bed without using bedrails?: A Little  How much help is needed moving to and from a bed to  a chair?: A Little  How much help is needed standing up from a chair using your arms?: A Little  How much help is needed walking in hospital room?: A Little  How much help is needed climbing 3-5 steps with a railing?: A Lot    AM-PAC Inpatient Mobility Raw Score : 17  AM-PAC Inpatient T-Scale Score : 42.13     Cutoff score <=171,2,3 had higher odds of discharging home with home health or need of SNF/IPR.    1. Diane U. Jette, Nadeen Augusta, Vinoth K. Ranganathan, Sandra D. Passek, Benjiman Bras. Cole Daubs.  Validity of the AM-PAC "6-Clicks" Inpatient Daily Activity and Basic Mobility Short Forms. Physical Therapy Mar 2014, 94 (3) 379-391; DOI: 10.2522/ptj.20130199  2. Warren M, Knecht J, Verheijde  J, Tompkins J. Association of AM-PAC "6-Clicks" Basic Mobility and Daily Activity Scores With Discharge Destination. Phys Ther. 2021 Apr 4;101(4):pzab043. doi: 10.1093/ptj/pzab043. PMID: 40981191.  3. Herbold J, Rajaraman D, Meridith Stanford, Agayby K, Hawley S. Activity Measure for Post-Acute Care "6-Clicks" Basic Mobility Scores Predict Discharge Destination After Acute Care Hospitalization in Select Patient Groups: A Retrospective, Observational Study. Arch Rehabil Res Clin Transl. 2022 Jul 16;4(3):100204. doi: 10.1016/j.[email or web address].[id number]. PMID: 08657846; PMCID: [id number].  4. Zorita Hiss, Coster W, Ni P. AM-PAC Short Forms Manual 4.0. Revised 10/2018.                                                                                                                                                                                                                               Pain Rating:  C/o back, hip, and R knee pain; did not rate pain   Pain Intervention(s):   rest    Activity Tolerance:   Fair , requires rest breaks, observed shortness of breath on exertion, and desaturates with activity and requires oxygen    After treatment:   Patient left in no apparent distress sitting up in chair, Call bell within reach, and Bed/ chair alarm activated    COMMUNICATION/EDUCATION:   The patient's plan of care was discussed with: physical therapist, occupational therapist, and registered nurse    Patient Education  Education Given To: Patient  Education Provided: Role of Therapy;Plan of Care;Equipment;Mobility Training;Fall Prevention Strategies  Education Method: Verbal  Barriers to Learning: Readiness to Learn  Education Outcome: Verbalized understanding;Continued education needed    Thank you for this referral.  Prestyn Mahn G Ariely Riddell, PT, DPT  Minutes: 28  Physical Therapy Evaluation Charge Determination   History Examination Presentation Decision-Making   HIGH Complexity :3+ comorbidities / personal factors will impact the  outcome/ POC  HIGH Complexity : 4+ Standardized tests and measures addressing body structure, function, activity limitation and / or participation in recreation  LOW Complexity : Stable, uncomplicated  AM-PAC  MEDIUM   Based on the above components, the patient evaluation is determined to be of the following complexity level: Low

## 2024-02-08 NOTE — Consults (Signed)
 Pulmonary, Critical Care, and Sleep Medicine    Initial Patient Consult    Name: Ronald Kennedy MRN: [id number]   DOB: 06/29/1961 Hospital: [facility]   Date: 02/08/2024        IMPRESSION:   Exertional hypoxemia of uncertain etiology, may be chronic (based on notes from 2021)  Has had some responsiveness to bronchodilators, so could be obstructive lung disease  Can not exclude cardiac causes or PAH  H/O PE, reports he completed treatment  DJD  Cigar smoker, quit 6 months ago      RECOMMENDATIONS:   O2, wean as tolerated. May need home O2.  Bronchodilators   Would like cPFTs  Check echo with bubble study  DVT prophylaxis  Further recs after diagnostic testing available.  Could potentially be done as outpatient if needed     Subjective:     This patient has been seen and evaluated at the request of Dr. Karole Kennedy for hypoxia. Patient is a 63 y.o. male former smoker (cigars for years, quit 6 months ago), presented for evaluation of several months of dyspnea on exertion.  He has had shortness of breath over that time with associated palpitations and lightheadedness.  No cough or sputum production.  Found to be hypoxic with exertion.  He has a h/o PE in 2021, and notes indicate that he had hypoxia with exertion then, but he was not sent home with O2 at that time.  He denies lower extremity edema.       Past Medical History:   Diagnosis Date    Alcohol use with alcohol-induced mood disorder (HCC) 02/07/2024    Degenerative joint disease of both hips     Hypertension     Multilevel degenerative disc disease     PE (pulmonary thromboembolism) (HCC) 2022    DVT/PE - treated with eliquis for 3 mos      Past Surgical History:   Procedure Laterality Date    ORTHOPEDIC SURGERY      left knee for torn ligament      Prior to Admission medications    Medication Sig Start Date End Date Taking? Authorizing Provider   albuterol  sulfate HFA (VENTOLIN  HFA) 108 (90 Base) MCG/ACT inhaler Inhale 2 puffs into the lungs 4  times daily as needed for Wheezing 02/05/24  Yes Venditti, Caesar Caster, MD   naproxen  (NAPROSYN ) 500 MG tablet Take 1 tablet by mouth 2 times daily 12/20/23  Yes Margy Shin, MD   predniSONE  10 MG (21) TBPK Follow instructions on package 05/04/23  Yes Danetta Dunnings, APRN - NP   diclofenac  sodium (VOLTAREN ) 1 % GEL Apply 4 g topically 4 times daily as needed for Pain 05/04/23  Yes Danetta Dunnings, APRN - NP     No Known Allergies   Social History     Tobacco Use    Smoking status: Never    Smokeless tobacco: Never   Substance Use Topics    Alcohol use: Yes      History reviewed. No pertinent family history.     @CMEDSCH @    Review of Systems:  Pertinent items are noted in HPI.    Objective:   Vital Signs:    BP 123/86   Pulse 54   Temp 97.7 F (36.5 C) (Oral)   Resp 16   Ht 1.854 m (6\' 1" )   Wt 104.3 kg (230 lb)   SpO2 99%   BMI 30.34 kg/m  Temp (24hrs), Avg:98.2 F (36.8 C), Min:97.7 F (36.5 C), Max:98.7 F (37.1 C)       Intake/Output:   Last shift:      No intake/output data recorded.  Last 3 shifts: 05/23 1901 - 05/25 0700  In: -   Out: 950 [Urine:950]    Intake/Output Summary (Last 24 hours) at 02/08/2024 1112  Last data filed at 02/08/2024 0650  Gross per 24 hour   Intake --   Output 950 ml   Net -950 ml      Physical Exam:   General:  Alert, cooperative, no distress, appears stated age.   Head:  Normocephalic, without obvious abnormality, atraumatic.   Eyes:  Conjunctivae/corneas clear.     Nose: Nares normal. Septum midline. Mucosa normal.     Throat: Lips, mucosa, and tongue normal.     Neck: Supple, symmetrical, trachea midline    Back:   Symmetric, no curvature. ROM normal.   Lungs:   Clear to auscultation bilaterally.   Chest wall:  No tenderness or deformity.   Heart:  Regular rate and rhythm    Abdomen:   Soft, non-tender. Bowel sounds normal.    Extremities: Extremities normal, atraumatic, no cyanosis    Skin: Skin color, texture, turgor normal. No rashes or lesions   Lymph  nodes: Cervical, supraclavicular nodes normal.   Neurologic: Grossly nonfocal     Data review:     Recent Results (from the past 24 hours)   Urinalysis with Microscopic    Collection Time: 02/07/24 12:48 PM   Result Value Ref Range    Color, UA DARK YELLOW      Appearance CLEAR CLEAR      pH, Urine 5.5 5.0 - 8.0      Protein, UA 30 (A) NEG mg/dL    Glucose, Ur Negative NEG mg/dL    Ketones, Urine 80 (A) NEG mg/dL    Blood, Urine Negative NEG      Urobilinogen, Urine 2.0 (H) 0.2 - 1.0 EU/dL    Nitrite, Urine Negative NEG      Leukocyte Esterase, Urine Negative NEG      WBC, UA 0-4 0 - 4 /hpf    RBC, UA 0-5 0 - 5 /hpf    Epithelial Cells, UA MODERATE (A) FEW /lpf    BACTERIA, URINE Negative NEG /hpf    Hyaline Casts, UA 2-5 0 - 2 /lpf   Bilirubin, Confirmatory    Collection Time: 02/07/24 12:48 PM   Result Value Ref Range    Bilirubin Confirmation, UA Negative     CBC with Auto Differential    Collection Time: 02/08/24  3:34 AM   Result Value Ref Range    WBC 3.0 (L) 4.1 - 11.1 K/uL    RBC 4.24 4.10 - 5.70 M/uL    Hemoglobin 12.9 12.1 - 17.0 g/dL    Hematocrit 16.1 09.6 - 50.3 %    MCV 89.9 80.0 - 99.0 FL    MCH 30.4 26.0 - 34.0 PG    MCHC 33.9 30.0 - 36.5 g/dL    RDW 04.5 40.9 - 81.1 %    Platelets 262 150 - 400 K/uL    MPV 9.4 8.9 - 12.9 FL    Nucleated RBCs 0.0 0 PER 100 WBC    nRBC 0.00 0.00 - 0.01 K/uL    Neutrophils % 86.6 (H) 32.0 - 75.0 %    Lymphocytes % 12.1 12.0 - 49.0 %    Monocytes % 1.0 (L) 5.0 -  13.0 %    Eosinophils % 0.0 0.0 - 7.0 %    Basophils % 0.0 0.0 - 1.0 %    Immature Granulocytes % 0.3 0.0 - 0.5 %    Neutrophils Absolute 2.60 1.80 - 8.00 K/UL    Lymphocytes Absolute 0.36 (L) 0.80 - 3.50 K/UL    Monocytes Absolute 0.03 0.00 - 1.00 K/UL    Eosinophils Absolute 0.00 0.00 - 0.40 K/UL    Basophils Absolute 0.00 0.00 - 0.10 K/UL    Immature Granulocytes Absolute 0.01 0.00 - 0.04 K/UL    Differential Type SMEAR SCANNED      RBC Comment NORMOCYTIC, NORMOCHROMIC     Magnesium    Collection Time:  02/08/24  3:34 AM   Result Value Ref Range    Magnesium 2.0 1.6 - 2.4 mg/dL   Comprehensive Metabolic Panel    Collection Time: 02/08/24  3:34 AM   Result Value Ref Range    Sodium 136 136 - 145 mmol/L    Potassium 3.9 3.5 - 5.1 mmol/L    Chloride 106 97 - 108 mmol/L    CO2 24 21 - 32 mmol/L    Anion Gap 6 2 - 12 mmol/L    Glucose 154 (H) 65 - 100 mg/dL    BUN 16 6 - 20 MG/DL    Creatinine 1.61 0.96 - 1.30 MG/DL    BUN/Creatinine Ratio 20 12 - 20      Est, Glom Filt Rate >90 >60 ml/min/1.45m2    Calcium 9.2 8.5 - 10.1 MG/DL    Total Bilirubin 0.6 0.2 - 1.0 MG/DL    ALT 18 12 - 78 U/L    AST 14 (L) 15 - 37 U/L    Alk Phosphatase 112 45 - 117 U/L    Total Protein 7.6 6.4 - 8.2 g/dL    Albumin 3.5 3.5 - 5.0 g/dL    Globulin 4.1 (H) 2.0 - 4.0 g/dL    Albumin/Globulin Ratio 0.9 (L) 1.1 - 2.2         Imaging:  I have personally reviewed the patient's radiographs and have reviewed the reports:  No acute process.  Chronic high density material in the lung bases, present since at least 2017 (oldest available image)        Leander Prior, MD

## 2024-02-09 LAB — COMPREHENSIVE METABOLIC PANEL
ALT: 16 U/L (ref 12–78)
AST: 14 U/L — ABNORMAL LOW (ref 15–37)
Albumin/Globulin Ratio: 0.9 — ABNORMAL LOW (ref 1.1–2.2)
Albumin: 3.4 g/dL — ABNORMAL LOW (ref 3.5–5.0)
Alk Phosphatase: 102 U/L (ref 45–117)
Anion Gap: 7 mmol/L (ref 2–12)
BUN/Creatinine Ratio: 14 (ref 12–20)
BUN: 13 mg/dL (ref 6–20)
CO2: 25 mmol/L (ref 21–32)
Calcium: 8.8 mg/dL (ref 8.5–10.1)
Chloride: 105 mmol/L (ref 97–108)
Creatinine: 0.94 mg/dL (ref 0.70–1.30)
Est, Glom Filt Rate: 90 mL/min/{1.73_m2} (ref >60)
Globulin: 3.8 g/dL (ref 2.0–4.0)
Glucose: 140 mg/dL — ABNORMAL HIGH (ref 65–100)
Potassium: 3.6 mmol/L (ref 3.5–5.1)
Sodium: 137 mmol/L (ref 136–145)
Total Bilirubin: 0.4 mg/dL (ref 0.2–1.0)
Total Protein: 7.2 g/dL (ref 6.4–8.2)

## 2024-02-09 LAB — CBC WITH AUTO DIFFERENTIAL
Basophils %: 0 % (ref 0.0–1.0)
Basophils Absolute: 0 10*3/uL (ref 0.00–0.10)
Eosinophils %: 0 % (ref 0.0–7.0)
Eosinophils Absolute: 0 10*3/uL (ref 0.00–0.40)
Hematocrit: 40 % (ref 36.6–50.3)
Hemoglobin: 13.1 g/dL (ref 12.1–17.0)
Immature Granulocytes %: 0.7 % — ABNORMAL HIGH (ref 0.0–0.5)
Immature Granulocytes Absolute: 0.04 10*3/uL (ref 0.00–0.04)
Lymphocytes %: 8.5 % — ABNORMAL LOW (ref 12.0–49.0)
Lymphocytes Absolute: 0.51 10*3/uL — ABNORMAL LOW (ref 0.80–3.50)
MCH: 30.9 pg (ref 26.0–34.0)
MCHC: 32.8 g/dL (ref 30.0–36.5)
MCV: 94.3 FL (ref 80.0–99.0)
MPV: 9.8 FL (ref 8.9–12.9)
Monocytes %: 2.3 % — ABNORMAL LOW (ref 5.0–13.0)
Monocytes Absolute: 0.14 10*3/uL (ref 0.00–1.00)
Neutrophils %: 88.5 % — ABNORMAL HIGH (ref 32.0–75.0)
Neutrophils Absolute: 5.31 10*3/uL (ref 1.80–8.00)
Nucleated RBCs: 0 /100{WBCs}
Platelets: 254 10*3/uL (ref 150–400)
RBC: 4.24 M/uL (ref 4.10–5.70)
RDW: 12.2 % (ref 11.5–14.5)
WBC: 6 10*3/uL (ref 4.1–11.1)
nRBC: 0 10*3/uL (ref 0.00–0.01)

## 2024-02-09 LAB — THYROID CASCADE PROFILE: TSH: 2.53 u[IU]/mL (ref 0.450–4.500)

## 2024-02-09 LAB — MAGNESIUM: Magnesium: 2.2 mg/dL (ref 1.6–2.4)

## 2024-02-09 MED FILL — AZITHROMYCIN 250 MG PO TABS: 250 MG | ORAL | Qty: 2 | Fill #0

## 2024-02-09 MED FILL — ENOXAPARIN SODIUM 30 MG/0.3ML IJ SOSY: 30 MG/0.3ML | INTRAMUSCULAR | Qty: 0.3 | Fill #0

## 2024-02-09 MED FILL — IPRATROPIUM-ALBUTEROL 0.5-2.5 (3) MG/3ML IN SOLN: 0.5-2.5 (3) MG/3ML | RESPIRATORY_TRACT | Qty: 3 | Fill #0

## 2024-02-09 MED FILL — OXYCODONE HCL 5 MG PO TABS: 5 MG | ORAL | Qty: 1 | Fill #0

## 2024-02-09 MED FILL — METHYLPREDNISOLONE SODIUM SUCC 40 MG IJ SOLR: 40 MG | INTRAMUSCULAR | Qty: 40 | Fill #0

## 2024-02-09 NOTE — Progress Notes (Signed)
 End of Shift Note    Bedside shift change report given to Von, RN (oncoming nurse) by Bud Care, RN (offgoing nurse).  Report included the following information SBAR, Intake/Output, MAR, and Recent Results    Shift worked:  0700-1900     Shift summary and any significant changes:     Patient tolerated care well. Peds given per MAR. PRN oxycodone administered x1 this shift. Cardiac consult called at 0759. No complaints of pain, SOB, or discomfort at shift change. Caring rounds completed. Plan of care ongoing.      Concerns for physician to address:  None      Zone phone for oncoming shift:   [phone]       Activity:  Level of Assistance: Minimal assist, patient does 75% or more  Number times ambulated in hallways past shift: 0  Number of times OOB to chair past shift: 2    Cardiac:   Cardiac Monitoring: Yes           Access:  Current line(s): PIV     Genitourinary:        Respiratory:   O2 Device: None (Room air)  Chronic home O2 use?: NO  Incentive spirometer at bedside: NO    GI:  Last BM (including prior to admit): 02/05/24  Current diet:  ADULT DIET; Regular  Passing flatus: YES    Pain Management:   Patient states pain is manageable on current regimen: YES    Skin:  Braden Scale Score: 20  Interventions: Wound Offloading (Prevention Methods): Pillows, Repositioning, Turning    Patient Safety:  Fall Risk: Nursing Judgement-Fall Risk High(Add Comments): Yes  Fall Risk Interventions  Nursing Judgement-Fall Risk High(Add Comments): Yes  Toilet Every 2 Hours-In Advance of Need: Yes  Hourly Visual Checks: Awake, In bed  Fall Visual Posted: Armband, Socks, Fall sign posted  Room Door Open: Yes  Alarm On: Bed  Patient Moved Closer to Nursing Station: No    Active Consults:   IP CONSULT TO HOSPITALIST  IP CONSULT TO SOCIAL WORK  IP CONSULT TO PULMONOLOGY  IP CONSULT TO CARDIOLOGY    Length of Stay:  Expected LOS: 2  Actual LOS: 2    Jarrod Mcenery, RN

## 2024-02-09 NOTE — Progress Notes (Signed)
 End of Shift Note    Bedside shift change report given to Leann, Charity fundraiser (oncoming nurse) by Basilia Lima, RN (offgoing nurse).  Report included the following information SBAR, Kardex, MAR, and Recent Results    Shift worked:  1900-700     Shift summary and any significant changes:    Pt received scheduled meds per MAR. Pt denied pain during shift. Pt standby assist from chair to bed and to bathroom. Pt safety and caring rounds complete.      Concerns for physician to address:  None   Zone phone for oncoming shift:  [phone]       Activity:  Level of Assistance: Minimal assist, patient does 75% or more  Number times ambulated in hallways past shift: 0  Number of times OOB to chair past shift: 0    Cardiac:   Cardiac Monitoring: Yes           Access:  Current line(s): PIV     Genitourinary:        Respiratory:   O2 Device: None (Room air)  Chronic home O2 use?: NO  Incentive spirometer at bedside: YES    GI:  Last BM (including prior to admit): 02/05/24  Current diet:  ADULT DIET; Regular  Passing flatus: YES    Pain Management:   Patient states pain is manageable on current regimen: N/A    Skin:  Braden Scale Score: 19  Interventions: Wound Offloading (Prevention Methods): Pillows, Repositioning, Turning    Patient Safety:  Fall Risk: Nursing Judgement-Fall Risk High(Add Comments): Yes  Fall Risk Interventions  Nursing Judgement-Fall Risk High(Add Comments): Yes  Toilet Every 2 Hours-In Advance of Need: Yes  Hourly Visual Checks: Quiet, In bed  Fall Visual Posted: Armband, Socks, Fall sign posted  Room Door Open: Deferred to decrease stimulation  Alarm On: Bed  Patient Moved Closer to Nursing Station: No    Active Consults:   IP CONSULT TO HOSPITALIST  IP CONSULT TO SOCIAL WORK  IP CONSULT TO PULMONOLOGY  IP CONSULT TO CARDIOLOGY    Length of Stay:  Expected LOS: 2  Actual LOS: 2    Basilia Lima, RN

## 2024-02-09 NOTE — Plan of Care (Signed)
 Problem: Discharge Planning  Goal: Discharge to home or other facility with appropriate resources  Outcome: Progressing     Problem: Pain  Goal: Verbalizes/displays adequate comfort level or baseline comfort level  Outcome: Progressing     Problem: Safety - Adult  Goal: Free from fall injury  Outcome: Progressing     Problem: Respiratory - Adult  Goal: Achieves optimal ventilation and oxygenation  Outcome: Progressing     Problem: Physical Therapy - Adult  Goal: By Discharge: Performs mobility at highest level of function for planned discharge setting.  See evaluation for individualized goals.  Description: FUNCTIONAL STATUS PRIOR TO ADMISSION: Patient is ambulatory with SPC at baseline. Uses R knee brace and LSO style back brace due to back pain and R knee instability. + fall history. Does not drive. Uses public bus for transportation. Independent for ADLs. Does not wear supplemental O2 at baseline.     HOME SUPPORT PRIOR TO ADMISSION: The patient lived alone. Reported that he has "a guy," otherwise without support.    Physical Therapy Goals  Initiated 02/08/2024  1.  Patient will move from supine to sit and sit to supine, scoot up and down, and roll side to side in bed with modified independence within 7 day(s).    2.  Patient will perform sit to stand with modified independence within 7 day(s).  3.  Patient will transfer from bed to chair and chair to bed with modified independence using the least restrictive device within 7 day(s).  4.  Patient will ambulate with modified independence for 250 feet with the least restrictive device within 7 day(s).   5.  Patient will ascend/descend 10 stairs with 1 handrail(s) with modified independence within 7 day(s).   02/08/2024 1248 by Zaccone, Irene G, PT  Outcome: Progressing     Problem: Occupational Therapy - Adult  Goal: By Discharge: Performs self-care activities at highest level of function for planned discharge setting.  See evaluation for individualized  goals.  Description: FUNCTIONAL STATUS PRIOR TO ADMISSION:  Patient is ambulatory with SPC at baseline. Uses R knee brace and LSO style back brace due to back pain and R knee instability. + fall history. Does not drive. Uses public bus for transportation. Independent for ADLs. Does not wear supplemental O2 at baseline.    , Prior Level of Assist for ADLs: Independent,  ,  ,  ,  ,  , Prior Level of Assist for Homemaking: Independent,  , Prior Level of Assist for Transfers: Independent, Active Driver: No     HOME SUPPORT: Patient lived alone w/ helpful neighbor close by.    Occupational Therapy Goals:  Initiated 02/08/2024  1.  Patient will perform grooming in stance with Independence within 7 day(s).  2.  Patient will perform upper body dressing with Independence within 7 day(s).  3.  Patient will perform lower body dressing with Independence within 7 day(s).  4.  Patient will perform toilet transfers with Modified Independence  within 7 day(s).  5.  Patient will perform all aspects of toileting with Independence within 7 day(s).  6.  Patient will participate in upper extremity therapeutic exercise/activities with Supervision for 7 minutes within 7 day(s).    7.  Patient will utilize energy conservation techniques during functional activities with verbal cues within 7 day(s).   02/08/2024 1349 by Dany Dyke, OT  Outcome: Progressing  02/08/2024 1348 by Dany Dyke, OT  Outcome: Progressing

## 2024-02-09 NOTE — Consults (Signed)
 [facility]  [address]  [place], [place] [address]  [phone]  [email or web address]    Name: Ronald Kennedy  May 04, 1961 [id number]  02/09/2024 12:44 PM     Assessment/Plan:     Assessment:       Principal Problem:    Acute respiratory failure with hypoxia (HCC)  Active Problems:    Heart palpitations    Exertional dyspnea    Acute on chronic low back pain  Resolved Problems:    * No resolved hospital problems. *    Agree with echo with bubble study.  No overt CHF.  Exertional hypoxemia not typically see with CAD    Admit Date: 02/07/2024     Admit Diagnosis: Shortness of breath [R06.02]  Hypoxemia [R09.02]  Dyspnea on exertion [R06.09]  Acute respiratory failure with hypoxia (HCC) [J96.01]  Primary Care Physician:No primary care provider on file.     Attending Provider: Treasa Friend, MD  Primary Cardiologist: bk  Consulting Cardiologist: Shan Dare FOR CONSULT: exertional dyspnea and hypoxemia    Subjective:     Fue Cervenka is a 63 y.o. male admitted for Shortness of breath [R06.02]  Hypoxemia [R09.02]  Dyspnea on exertion [R06.09]  Acute respiratory failure with hypoxia (HCC) [J96.01].Patient complains of   DOE with hypoxemia.  Sats OK at rest.  Remote PE.  No orthopnea, PND, or pedal edema.  No CP .       Review of Symptoms:  Pertinent items are noted in HPI.        Past Medical History:   Diagnosis Date    Alcohol use with alcohol-induced mood disorder (HCC) 02/07/2024    Degenerative joint disease of both hips     Hypertension     Multilevel degenerative disc disease     PE (pulmonary thromboembolism) (HCC) 2022    DVT/PE - treated with eliquis for 3 mos     Past Surgical History:   Procedure Laterality Date    ORTHOPEDIC SURGERY      left knee for torn ligament     Current Facility-Administered Medications   Medication Dose Route Frequency    ipratropium 0.5 mg-albuterol  2.5 mg (DUONEB) nebulizer solution 1 Dose  1 Dose Inhalation BID RT    ondansetron  (ZOFRAN) injection 4 mg  4 mg IntraVENous Q4H PRN    acetaminophen (TYLENOL) tablet 650 mg  650 mg Oral Q4H PRN    sodium chloride flush 0.9 % injection 5-40 mL  5-40 mL IntraVENous 2 times per day    sodium chloride flush 0.9 % injection 5-40 mL  5-40 mL IntraVENous PRN    0.9 % sodium chloride infusion   IntraVENous PRN    enoxaparin Sodium (LOVENOX) injection 30 mg  30 mg SubCUTAneous BID    [START ON 02/10/2024] predniSONE  (DELTASONE ) tablet 40 mg  40 mg Oral Daily    benzonatate (TESSALON) capsule 100 mg  100 mg Oral TID PRN    sodium chloride flush 0.9 % injection 5-40 mL  5-40 mL IntraVENous 2 times per day    0.9 % sodium chloride infusion   IntraVENous PRN    potassium chloride (KLOR-CON M) extended release tablet 40 mEq  40 mEq Oral PRN    Or    potassium bicarb-citric acid (EFFER-K) effervescent tablet 40 mEq  40 mEq Oral PRN    Or    potassium chloride 10 mEq/100 mL IVPB (Peripheral Line)  10 mEq IntraVENous PRN    magnesium sulfate 2000 mg  in 50 mL IVPB premix  2,000 mg IntraVENous PRN    melatonin tablet 3 mg  3 mg Oral Nightly PRN    polyethylene glycol (GLYCOLAX) packet 17 g  17 g Oral Daily PRN    acetaminophen (TYLENOL) tablet 650 mg  650 mg Oral Q6H PRN    Or    acetaminophen (TYLENOL) suppository 650 mg  650 mg Rectal Q6H PRN       No Known Allergies   History reviewed. No pertinent family history.   Social History     Socioeconomic History    Marital status: Divorced     Spouse name: None    Number of children: None    Years of education: None    Highest education level: None   Tobacco Use    Smoking status: Never    Smokeless tobacco: Never   Substance and Sexual Activity    Alcohol use: Yes    Drug use: Never     Social Drivers of Health     Food Insecurity: No Food Insecurity (02/07/2024)    Hunger Vital Sign     Worried About Running Out of Food in the Last Year: Never true     Ran Out of Food in the Last Year: Never true   Transportation Needs: Unmet Transportation Needs (02/07/2024)     PRAPARE - Therapist, art (Medical): Yes     Lack of Transportation (Non-Medical): Yes   Housing Stability: Low Risk  (02/07/2024)    Housing Stability Vital Sign     Unable to Pay for Housing in the Last Year: No     Number of Times Moved in the Last Year: 0     Homeless in the Last Year: No          Objective:      Physical Exam  Vitals:    02/09/24 0758 02/09/24 0821 02/09/24 0901 02/09/24 0931   BP:  (!) 142/84     Pulse: 66 64     Resp: 18 18 16 18    Temp:  97.3 F (36.3 C)     TempSrc:  Oral     SpO2: 97% 97%     Weight:       Height:           General:  Alert, cooperative, no distress, appears stated age.   Eyes:  Conjunctivae/corneas clear. PERRL, EOMs intact. Fundi benign   Ears:  Normal TMs and external ear canals both ears.   Nose: Nares normal. No drainage or sinus tenderness.   Mouth/Throat: Moist mucous membranes. Dentition normal.    Neck: Supple, symmetrical, trachea midline, no carotid bruit and no JVD.   Back:   Symmetric, no curvature. ROM normal. No CVA tenderness.   Lungs:   Clear to auscultation bilaterally.   Heart:  Regular rate and rhythm, S1, S2 normal, no murmur, click, rub or gallop.   Abdomen:   Soft, non-tender. Bowel sounds normal. No masses,  No organomegaly.   Extremities: Extremities normal, atraumatic, no cyanosis or edema.   Vascular: 2+ and symmetric all extremities.   Skin: Skin color normal. No rashes or lesions   Lymph nodes: No Lymphadenopathy   Neurologic: CNII-XII intact. Normal strength throughout.       Telemetry: normal sinus rhythm  ECG: normal EKG, normal sinus rhythm, unchanged from previous tracings  Echocardiogram: not done    Intake/Output Summary (Last 24 hours) at 02/09/2024 1244  Last data filed  at 02/09/2024 0558  Gross per 24 hour   Intake --   Output 1025 ml   Net -1025 ml       Data Review:   Recent Labs     02/07/24  1037   TROPHS 11     Recent Labs     02/07/24  1037 02/08/24  0334 02/09/24  0505   NA 137 136 137   K 3.6 3.9 3.6    CL 103 106 105   CO2 20* 24 25   BUN 12 16 13    PHOS 2.8  --   --      Recent Labs     02/08/24  0334 02/09/24  0505   WBC 3.0* 6.0   HGB 12.9 13.1   HCT 38.1 40.0   PLT 262 254     No results for input(s): "INR" in the last 72 hours.    Invalid input(s): "PTTP", "PTP", "GPT", "SGOT", "AP"  No results for input(s): "CHOL", "HDLC", "LDLC", "HBA1C" in the last 72 hours.    Invalid input(s): "TGL"  Recent Labs     02/07/24  1037   CRP 0.33*   TSH 2.530     Thank you very much for this referral. I appreciate the opportunity to participate in this patient's care. I will follow along with above stated plan.    Victorine Grater, MD  CC:No primary care provider on file.

## 2024-02-09 NOTE — Progress Notes (Signed)
 ADULT PROTOCOL: JET AEROSOL ASSESSMENT    Patient  Martise Waddell     63 y.o.   male     02/09/2024  8:16 AM    Breath Sounds Pre Procedure: Breath Sounds Pre-Tx LUL: Clear                                  Breath Sounds Pre-Tx LLL: Clear        Breath Sounds Pre-Tx RUL: Clear        Breath Sounds Pre-Tx RML: Clear        Breath Sounds Pre-Tx RLL: Clear  Breath Sounds Post Procedure: Breath Sounds Post-Tx LUL: Clear          Breath Sounds Post-Tx LLL: Clear          Breath Sounds Post-Tx RUL: Clear          Breath Sounds Post-Tx RML: Clear          Breath Sounds Post-Tx RLL: Clear                                     Heart Rate: Pre procedure Pre-Tx Pulse: 66           Post procedure Post-Tx Pulse: 68    Resp Rate: Pre procedure Pre-Tx Resps: 18           Post procedure Post-Tx Resps: 18    Oxygen: O2 Therapy: Room air        Changed: No    SpO2:  SpO2: 97 %   without Oxygen                Nebulizer Therapy: Current medications Medications: Albuterol /Ipratropium      Changed: No      Problem List:   Patient Active Problem List   Diagnosis    NSTEMI (non-ST elevated myocardial infarction) (HCC)    Heart palpitations    Exertional dyspnea    Acute respiratory failure with hypoxia (HCC)    Acute on chronic low back pain       Respiratory Therapist: Daevion Navarette, RT

## 2024-02-09 NOTE — Plan of Care (Signed)
 Problem: Discharge Planning  Goal: Discharge to home or other facility with appropriate resources  02/09/2024 1142 by Bud Care, RN  Outcome: Progressing  02/09/2024 0214 by Basilia Lima, RN  Outcome: Progressing     Problem: Pain  Goal: Verbalizes/displays adequate comfort level or baseline comfort level  02/09/2024 1142 by Bud Care, RN  Outcome: Progressing  02/09/2024 0214 by Basilia Lima, RN  Outcome: Progressing     Problem: Safety - Adult  Goal: Free from fall injury  02/09/2024 1142 by Bud Care, RN  Outcome: Progressing  02/09/2024 0214 by Basilia Lima, RN  Outcome: Progressing     Problem: Respiratory - Adult  Goal: Achieves optimal ventilation and oxygenation  02/09/2024 1142 by Bud Care, RN  Outcome: Progressing  02/09/2024 0817 by Graydon Lazier, RT  Outcome: Progressing  02/09/2024 0214 by Basilia Lima, RN  Outcome: Progressing

## 2024-02-09 NOTE — Progress Notes (Signed)
 Hospitalist Progress Note    NAME:   Ronald Kennedy   DOB: 1960-10-08   MRN: [id number]     Date/Time: 02/09/2024 3:58 PM  Patient PCP: No primary care provider on file.    Estimated discharge date:  5/27  Barriers: Echo, cardiac, pulm cleared      Assessment / Plan:  Dyspnea on exertion  Cardiac versus pulmonary causes  History of smoking  Undiagnosed COPD  Anginal chest pain  History of PE, completed 3 months of DOAC  Repeat 5/22 CTA negative for PE  6-minute walk test before discharge  Follow-up with echo  Cardiac consult  EKG not not done, ordered again, troponin negative  Azithromycin 3 days for anti-inflammatory effect and prednisone   No history of DM or HTN  Monitor blood pressure  Cardio with appreciated, will follow with echo    Medical Decision Making:   I personally reviewed labs: Yes CBC, BMP  I personally reviewed imaging: Yes  I personally reviewed EKG: Yes  Toxic drug monitoring: Yes  Discussed case with: Yes        Code Status: Full code  DVT Prophylaxis: Lovenox  GI Prophylaxis:    Subjective:     Chief Complaint / Reason for Physician Visit  "Seen in telemetry floor, on room air, has no shortness of breath now or chest pain but he experience shortness of breath with moderate exertion, hypoxemia ".  Discussed with RN events overnight.       Objective:     VITALS:   Last 24hrs VS reviewed since prior progress note. Most recent are:  Patient Vitals for the past 24 hrs:   BP Temp Temp src Pulse Resp SpO2   02/09/24 0931 -- -- -- -- 18 --   02/09/24 0901 -- -- -- -- 16 --   02/09/24 0821 (!) 142/84 97.3 F (36.3 C) Oral 64 18 97 %   02/09/24 0758 -- -- -- 66 18 97 %   02/09/24 0308 (!) 148/83 97.9 F (36.6 C) Oral 64 18 98 %   02/08/24 2157 -- -- -- 72 15 99 %   02/08/24 2000 119/76 98.1 F (36.7 C) Oral 68 18 100 %         Intake/Output Summary (Last 24 hours) at 02/09/2024 1558  Last data filed at 02/09/2024 2409  Gross per 24 hour   Intake --   Output 1025 ml   Net -1025 ml        I had a face to  face encounter and independently examined this patient on 02/09/2024, as outlined below:  PHYSICAL EXAM:  General: Alert, cooperative  EENT:  EOMI. Anicteric sclerae.  Resp:  CTA bilaterally, no wheezing or rales.  No accessory muscle use  CV:  Regular  rhythm,  No edema  GI:  Soft, Non distended, Non tender.  +Bowel sounds  Neurologic:  Alert and oriented X 3, normal speech,   Psych:   Good insight. Not anxious nor agitated  Skin:  No rashes.  No jaundice    Reviewed most current lab test results and cultures  YES  Reviewed most current radiology test results   YES  Review and summation of old records today    NO  Reviewed patient's current orders and MAR    YES  PMH/SH reviewed - no change compared to H&P    Procedures: see electronic medical records for all procedures/Xrays and details which were not copied into this note but were reviewed prior to creation  of Plan.      LABS:  I reviewed today's most current labs and imaging studies.  Pertinent labs include:  Recent Labs     02/07/24  1037 02/08/24  0334 02/09/24  0505   WBC 5.7 3.0* 6.0   HGB 13.9 12.9 13.1   HCT 41.3 38.1 40.0   PLT 255 262 254     Recent Labs     02/07/24  1037 02/08/24  0334 02/09/24  0505   NA 137 136 137   K 3.6 3.9 3.6   CL 103 106 105   CO2 20* 24 25   GLUCOSE 78 154* 140*   BUN 12 16 13    CREATININE 0.87 0.80 0.94   CALCIUM 9.6 9.2 8.8   MG 1.9 2.0 2.2   PHOS 2.8  --   --    BILITOT 1.4* 0.6 0.4   AST 17 14* 14*   ALT 17 18 16        Signed: Treasa Friend, MD

## 2024-02-10 ENCOUNTER — Inpatient Hospital Stay: Admit: 2024-02-10 | Payer: Medicare (Managed Care)

## 2024-02-10 LAB — ECHO (TTE) COMPLETE (PRN CONTRAST/BUBBLE/STRAIN/3D)
AV Area by Peak Velocity: 2.8 cm2
AV Area by VTI: 2.7 cm2
AV Mean Gradient: 5 mmHg
AV Mean Velocity: 1 m/s
AV Peak Gradient: 8 mmHg
AV Peak Velocity: 1.4 m/s
AV VTI: 32.3 cm
AV Velocity Ratio: 1
AVA/BSA Peak Velocity: 1.2 cm2/m2
AVA/BSA VTI: 1.2 cm2/m2
Ao Root Index: 1.8 cm/m2
Aortic Root: 4.1 cm
Ascending Aorta Index: 1.75 cm/m2
Ascending Aorta: 4 cm
Body Surface Area: 2.32 m2
E/E' Lateral: 8.34
E/E' Ratio (Averaged): 10.12
E/E' Septal: 11.9
EF Physician: 55 %
LA Diameter: 3.7 cm
LA Size Index: 1.62 cm/m2
LA/AO Root Ratio: 0.9
LV E' Lateral Velocity: 9.23 cm/s
LV E' Septal Velocity: 6.47 cm/s
LV EDV A4C: 119 mL
LV EDV Index A4C: 52 mL/m2
LV ESV A4C: 67 mL
LV ESV Index A4C: 29 mL/m2
LV Ejection Fraction A4C: 44 %
LVIDs Index: 0.79 cm/m2
LVIDs: 1.8 cm
LVOT Area: 2.8 cm2
LVOT Diameter: 1.9 cm
LVOT Mean Gradient: 4 mmHg
LVOT Peak Gradient: 8 mmHg
LVOT Peak Velocity: 1.4 m/s
LVOT SV: 85.6 mL
LVOT Stroke Volume Index: 37.5 mL/m2
LVOT VTI: 30.2 cm
LVOT:AV VTI Index: 0.93
MR Peak Velocity: 3 m/s
MR Peak Velocity: 4.9 m/s
MR VTI: 119.7 cm
MV A Velocity: 0.74 m/s
MV E Velocity: 0.77 m/s
MV E Wave Deceleration Time: 237.5 ms
MV E/A: 1.04
PV Max Velocity: 1 m/s
PV Peak Gradient: 4 mmHg
RV Free Wall Peak S': 12.2 cm/s
RVIDd: 3.7 cm
TAPSE: 2.4 cm (ref 1.7–?)
TR Max Velocity: 1.78 m/s
TR Peak Gradient: 13 mmHg

## 2024-02-10 LAB — EKG 12-LEAD
Atrial Rate: 67 {beats}/min
Diagnosis: NORMAL
P Axis: 69 degrees
P-R Interval: 150 ms
Q-T Interval: 396 ms
QRS Duration: 92 ms
QTc Calculation (Bazett): 418 ms
R Axis: 63 degrees
T Axis: 64 degrees
Ventricular Rate: 67 {beats}/min

## 2024-02-10 MED ORDER — PREDNISONE 20 MG PO TABS
20 | ORAL_TABLET | ORAL | 0 refills | 7.00000 days | Status: AC
Start: 2024-02-10 — End: 2024-02-22

## 2024-02-10 MED ORDER — SULFUR HEXAFLUORIDE MICROSPH 60.7-25 MG IJ SUSR
60.7-25 | INTRAMUSCULAR | Status: AC
Start: 2024-02-10 — End: ?

## 2024-02-10 MED FILL — ACETAMINOPHEN 325 MG PO TABS: 325 MG | ORAL | Qty: 2 | Fill #0

## 2024-02-10 MED FILL — ENOXAPARIN SODIUM 30 MG/0.3ML IJ SOSY: 30 MG/0.3ML | INTRAMUSCULAR | Qty: 0.3 | Fill #0

## 2024-02-10 MED FILL — PREDNISONE 20 MG PO TABS: 20 MG | ORAL | Qty: 2 | Fill #0

## 2024-02-10 MED FILL — IPRATROPIUM-ALBUTEROL 0.5-2.5 (3) MG/3ML IN SOLN: 0.5-2.5 (3) MG/3ML | RESPIRATORY_TRACT | Qty: 3 | Fill #0

## 2024-02-10 MED FILL — LUMASON 60.7-25 MG IJ SUSR: 60.7-25 MG | INTRAMUSCULAR | Qty: 5 | Fill #0

## 2024-02-10 NOTE — Progress Notes (Signed)
 Spiritual Health History and Assessment/Progress Note  [facility]    Loneliness/Social Isolation,  ,  ,      Name: Ronald Kennedy MRN: [id number]    Age: 63 y.o.     Sex: male   Language: English   Religion: Christian   Acute respiratory failure with hypoxia (HCC)     Date: 02/10/2024            Total Time Calculated: 25 min              Spiritual Assessment began in [facility] 3 MED TELE        Referral/Consult From: Rounding   Encounter Overview/Reason: Loneliness/Social Isolation  Service Provided For: Patient    Faith, Belief, Meaning:   Patient has beliefs or practices that help with coping during difficult times  Family/Friends No family/friends present      Importance and Influence:  Patient has spiritual/personal beliefs that influence decisions regarding their health  Family/Friends No family/friends present    Community:  Patient feels well-supported. Support system includes: Other: [name] is his support. [name], [name]  Family/Friends No family/friends present    Assessment and Plan of Care:     Patient Interventions include: Facilitated expression of thoughts and feelings and Explored spiritual coping/struggle/distress, Engaged in theological refection   Family/Friends Interventions include: No family/friends present    Patient Plan of Care: Spiritual Care available upon further referral  Family/Friends Plan of Care: No family/friends present    Electronically signed by Sheikh Leverich, Chaplain on 02/10/2024 at 11:58 AM

## 2024-02-10 NOTE — Progress Notes (Signed)
 As per supervisor, pt ride will be here at 2030H      2023H Pt transported via wheelchair accompanied by our tech Romal. AO x 4, stable, all belongings were returned to patient.

## 2024-02-10 NOTE — Progress Notes (Signed)
 Physician Progress Note      PATIENTMASAYOSHI, [name]  CSN #:                  [id number]  DOB:                       Jan 26, 1961  ADMIT DATE:       02/07/2024 10:26 AM  DISCH DATE:        02/10/2024 8:30 PM  RESPONDING  PROVIDER #:        Treasa Friend MD          QUERY TEXT:    Acute respiratory failure is documented in the medical record (IM H&P 05/24).   Please provide additional clinical indicators supportive of your   documentation. Or please document if the diagnosis of acute respiratory   failure has been ruled out after study.    The clinical indicators include:  "Ronald Kennedy is a 63 y.o. male who presents with a cc of dyspnea on   exertion"..."Mental Status: He is alert and oriented to person, place, and   time". (ED Provider Notes by Catlett, Vernon Goodpasture, MD)  -"admitted for acute hypoxic respiratory failure of unknown   etiology"..."General:He is not in acute distress". (IM H&P 05/24)  -"Exertional hypoxemia of uncertain etiology".Aaron AasAaron Aas"Has had some responsiveness   to bronchodilators, so could be obstructive lung disease". (Pulmo PN 05/27)  -"Dyspnea on exertion"..."Cardiac versus pulmonary causes"..."Undiagnosed   COPD".Aaron AasAaron Aas"Anginal chest pain". (Discharge Summary by Treasa Friend, MD at   02/10/2024)    Vitals 05/24:  BP 157/96, Pulse 73, Resp 18, Temp 96%    Treatments: Duoneb  nebulizer, Methylprednisolone  Sodium Succinate IV,   Prednisone  tablet, Azithromycin  tablet  Options provided:  -- Acute Respiratory Failure as evidenced by, Please document evidence.  -- Acute Respiratory Failure ruled out after study  -- Other - I will add my own diagnosis  -- Disagree - Not applicable / Not valid  -- Disagree - Clinically unable to determine / Unknown  -- Refer to Clinical Documentation Reviewer    PROVIDER RESPONSE TEXT:    This patient is in acute respiratory failure as evidenced by    Query created by: Rapheal Cable on 02/16/2024 3:47 PM      Electronically signed by:  Treasa Friend MD 02/20/2024  9:16 AM

## 2024-02-10 NOTE — Progress Notes (Signed)
.  DISCHARGE NOTE FROM MED/TELE NURSE    Patient determined to be stable for discharge by attending provider. I have reviewed the discharge instructions and follow-up appointments with the Patient. They verbalized understanding and all questions were answered to their satisfaction. No complaints or further questions were expressed.      Medications sent to pharmacy Appropriate educational materials and medication side effect teaching were provided.      PIV were removed prior to discharge.     Patient did not discharge with any line, foley, or drain.    All personal items collected during admission were returned to the patient prior to discharge.    Post-op patient: No      Sundra Engel, RN

## 2024-02-10 NOTE — Progress Notes (Signed)
..  End of Shift Note    Bedside shift change report given to Ave Leisure, Charity fundraiser (oncoming nurse) by Sundra Engel, RN (offgoing nurse).  Report included the following information SBAR    Shift worked:  0700 - 1900     Shift summary and any significant changes:    Pt. Tolerated all medication w/o issue.  No c/o pain or distress. Pt. Has been discharged but his ride never showed up.  Spoke with nursing supervisor and she said that she will reorder a ride for him.     Concerns for physician to address: No     Zone phone for oncoming shift:   No       Activity:  Level of Assistance: Minimal assist, patient does 75% or more  Number times ambulated in hallways past shift: 0  Number of times OOB to chair past shift: 0    Cardiac:   Cardiac Monitoring: Yes      Cardiac Rhythm: Junctional rhythm    Access:  Current line(s): PIV     Genitourinary:        Respiratory:   O2 Device: None (Room air)  Chronic home O2 use?: NO  Incentive spirometer at bedside: NO    GI:  Last BM (including prior to admit): 02/05/24  Current diet:  ADULT DIET; Regular  Passing flatus: YES    Pain Management:   Patient states pain is manageable on current regimen: YES    Skin:  Braden Scale Score: 20  Interventions: Wound Offloading (Prevention Methods): Repositioning    Patient Safety:  Fall Risk: Nursing Judgement-Fall Risk High(Add Comments): Yes  Fall Risk Interventions  Nursing Judgement-Fall Risk High(Add Comments): Yes  Toilet Every 2 Hours-In Advance of Need: Yes  Hourly Visual Checks: Awake, In bed  Fall Visual Posted: Armband, Socks, Fall sign posted  Room Door Open: Yes  Alarm On: Bed  Patient Moved Closer to Nursing Station: No    Active Consults:   IP CONSULT TO HOSPITALIST  IP CONSULT TO SOCIAL WORK  IP CONSULT TO PULMONOLOGY  IP CONSULT TO CARDIOLOGY    Length of Stay:  Expected LOS: 3  Actual LOS: 3    Sundra Engel, RN

## 2024-02-10 NOTE — Care Coordination-Inpatient (Signed)
 Care Management Initial Assessment       RUR: 10%  Readmission? No  1st IM letter given? Yes   1st Tricare letter given: No     Chart reviewed. CM aware of conditional discharge order- pending cardiology clearance. Met with pt at the bedside to introduce self and role. Verified contact information and demographics. Pt lives alone in an apartment located on the 2nd floor with 10 STE. Pt is independent and ambulates with a cane at baseline. He uses public transportation (city bus) or Medicaid transportation as needed. Preferred pharmacy is [facility] on [address] and [address]. Does not have current PCP but is planning to start care at [facility] next month. No prior hx of SNF/rehab noted. Pt will need ride assistance at discharge.    CM available to assist with setting up d/c transport until 4:30PM today. If after 4:30PM, please contact nursing supervisor for further assistance. Pt has not yet been cleared by cardiology. Informed assigned RN of the above.         02/10/24 1552   Service Assessment   Patient Orientation Alert and Oriented   Cognition Alert   History Provided By Patient   Primary Caregiver Self   Support Systems Friends/Neighbors;Family Members   Patient's Healthcare Decision Maker is: Legal Next of Kin   PCP Verified by CM No  (will be starting at [facility] in June 2025)   Prior Functional Level Independent in ADLs/IADLs   Current Functional Level Independent in ADLs/IADLs   Can patient return to prior living arrangement Yes   Ability to make needs known: Good   Family able to assist with home care needs: No   Would you like for me to discuss the discharge plan with any other family members/significant others, and if so, who? No   Financial Resources Medicare;Medicaid   Social/Functional History   Lives With Alone   Type of Home Apartment   Home Layout One level  (on 2nd floor)   Home Access Stairs to enter with rails   Entrance Stairs - Number of Steps 10   Bathroom Shower/Tub Tub/Shower unit    [employer] Grab bars in Immunologist   Home Equipment [employer] Help From [place])   Prior Level of Assist for ADLs Independent   Prior Level of Assist for [employer] Independent   Prior Level of Assist for Transfers Independent   Active Driver No   Mode of Transportation Bus   Occupation Retired   Building control surveyor   Type of Residence Apartment   Living Arrangements Alone   Current Services Prior To Admission None   Potential Assistance Needed Outpatient PT/OT   DME Ordered? No   Potential Assistance Purchasing Medications No   Type of Home Care Services None   Patient expects to be discharged to: Apartment   Services At/After Discharge   Transition of Care Consult (CM Consult) Discharge Planning   Services At/After Discharge None   [facility] Information Provided? No   Mode of Transport at Discharge Other (see comment)  (round trip)   Confirm Follow Up Transport Cab   Condition of Participation: Discharge Planning   The Plan for Transition of Care is related to the following treatment goals: return to baseline   The Patient and/or Patient Representative was provided with a Choice of Provider? Patient   The Patient and/Or Patient Representative agree with the Discharge Plan? Yes   Freedom of Choice  list was provided with basic dialogue that supports the patient's individualized plan of care/goals, treatment preferences, and shares the quality data associated with the providers?  No         Advance Care Planning     General Advance Care Planning (ACP) Conversation    Date of Conversation: 02/10/2024  Conducted with: Patient with Decision Making Capacity  Other persons present: None    Healthcare Decision Maker: No healthcare decision makers have been documented.     Today we discussed Diplomatic Services operational officer. The patient is considering options.  Content/Action Overview:  Has NO ACP documents-Information provided  Reviewed  DNR/DNI and patient elects Full Code (Attempt Resuscitation)  Length of Voluntary ACP Conversation in minutes:  <16 minutes (Non-Billable)         Vedia Geralds, MSW   Care Manager, [facility]  [phone]

## 2024-02-10 NOTE — Discharge Summary (Incomplete)
 Discharge Summary    Name: Ronald Kennedy  [id number]  Date of Birth: [birth year] (Age: 63 y.o.)   Date of Admission: 02/07/2024  Date of Discharge: 02/10/2024  Attending Physician: Treasa Friend, MD    Discharge Diagnosis:     Consultations:  IP CONSULT TO HOSPITALIST  IP CONSULT TO SOCIAL WORK  IP CONSULT TO PULMONOLOGY  IP CONSULT TO CARDIOLOGY      Brief Admission History/Reason for Admission Per Beulah Brunt, MD:       Brief Hospital Course by Main Problems:     Dyspnea on exertion  Cardiac versus pulmonary causes  History of smoking  Undiagnosed COPD  Anginal chest pain  History of PE, completed 3 months of DOAC  Repeat 5/22 CTA negative for PE  6-minute walk test before discharge  Follow-up with echo  Cardiac consult  EKG not not done, ordered again, troponin negative  Azithromycin  3 days for anti-inflammatory effect and prednisone   No history of DM or HTN  Monitor blood pressure  Cardio with appreciated, will follow with echo  5/27 pulm cleared the patient, inhaler sent to the pharmacy  Echo showed:    Discharge Exam:  Patient seen and examined by me on discharge day.  Pertinent Findings:  Patient Vitals for the past 24 hrs:   BP Temp Temp src Pulse Resp SpO2   02/10/24 1519 (!) 151/83 97.9 F (36.6 C) Oral 61 16 98 %   02/10/24 1113 (!) 164/96 97.4 F (36.3 C) Oral 63 16 --   02/10/24 0811 -- -- -- -- -- 99 %   02/10/24 0800 135/81 98.4 F (36.9 C) Oral 61 16 98 %   02/10/24 0753 -- -- -- 57 18 98 %   02/10/24 0339 (!) 153/93 98.1 F (36.7 C) Oral 57 12 98 %   02/09/24 2253 -- -- -- 62 20 97 %   02/09/24 2233 -- -- -- 62 20 97 %   02/09/24 1955 136/79 98.2 F (36.8 C) Oral 59 16 100 %   02/09/24 1621 (!) 148/87 98.2 F (36.8 C) Oral 56 -- --       Gen:    Not in distress  Chest: Clear lungs  CVS:   Regular rhythm.  No edema  Abd:  Soft, not distended, not tender  Neuro: awake, moving all exts    Discharge/Recent Laboratory Results:  Recent Labs     02/09/24  0505   NA  137   K 3.6   CL 105   CO2 25   BUN 13   CREATININE 0.94   GLUCOSE 140*   CALCIUM 8.8   MG 2.2     Recent Labs     02/09/24  0505   HGB 13.1   HCT 40.0   WBC 6.0   PLT 254       Discharge Medications:     Medication List        ASK your doctor about these medications      albuterol  sulfate HFA 108 (90 Base) MCG/ACT inhaler  Commonly known as: Ventolin  HFA  Inhale 2 puffs into the lungs 4 times daily as needed for Wheezing     diclofenac  sodium 1 % Gel  Commonly known as: VOLTAREN   Apply 4 g topically 4 times daily as needed for Pain     naproxen  500 MG tablet  Commonly known as: Naprosyn   Take 1 tablet by mouth 2 times daily     predniSONE  10 MG (21) Tbpk  Follow instructions on package                  DISPOSITION:    Home with Family:       Home with HH/PT/OT/RN:    SNF/LTC:    SAHR:    OTHER:            Code status:   Recommended diet: {diet:18262}  Recommended activity: {discharge activity:18261}  Wound care: {WOUND OZDG:64403474}      Follow up with:   PCP : No primary care provider on file.    No follow-up provider specified.        Total time in minutes spent coordinating this discharge (includes going over instructions, follow-up, prescriptions, and preparing report for sign off to her PCP) :  35 minutes

## 2024-02-10 NOTE — Progress Notes (Signed)
 [facility]  [address]  [place], [place] [address]  [phone]  [email or web address]  CARDIOLOGY PROGRESS NOTE    02/10/2024 5:01 PM    Admit Date: 02/07/2024    Admit Diagnosis:   Shortness of breath [R06.02]  Hypoxemia [R09.02]  Dyspnea on exertion [R06.09]  Acute respiratory failure with hypoxia (HCC) [J96.01]    Subjective:     Ronald Kennedy was seen and examined at the bedside. Breathing all the way better, walked in the hallway without symptoms. Lives in [place] and would like to find a cardiologist closer to where he lives.     BP (!) 151/83   Pulse 61   Temp 97.9 F (36.6 C) (Oral)   Resp 16   Ht 1.854 m (6\' 1" )   Wt 104.3 kg (230 lb)   SpO2 98%   BMI 30.34 kg/m     Current Facility-Administered Medications   Medication Dose Route Frequency    ipratropium 0.5 mg-albuterol  2.5 mg (DUONEB) nebulizer solution 1 Dose  1 Dose Inhalation BID RT    ondansetron (ZOFRAN) injection 4 mg  4 mg IntraVENous Q4H PRN    acetaminophen (TYLENOL) tablet 650 mg  650 mg Oral Q4H PRN    sodium chloride flush 0.9 % injection 5-40 mL  5-40 mL IntraVENous 2 times per day    sodium chloride flush 0.9 % injection 5-40 mL  5-40 mL IntraVENous PRN    0.9 % sodium chloride infusion   IntraVENous PRN    enoxaparin Sodium (LOVENOX) injection 30 mg  30 mg SubCUTAneous BID    predniSONE  (DELTASONE ) tablet 40 mg  40 mg Oral Daily    benzonatate (TESSALON) capsule 100 mg  100 mg Oral TID PRN    sodium chloride flush 0.9 % injection 5-40 mL  5-40 mL IntraVENous 2 times per day    0.9 % sodium chloride infusion   IntraVENous PRN    potassium chloride (KLOR-CON M) extended release tablet 40 mEq  40 mEq Oral PRN    Or    potassium bicarb-citric acid (EFFER-K) effervescent tablet 40 mEq  40 mEq Oral PRN    Or    potassium chloride 10 mEq/100 mL IVPB (Peripheral Line)  10 mEq IntraVENous PRN    magnesium sulfate 2000 mg in 50 mL IVPB premix  2,000 mg IntraVENous PRN    melatonin tablet 3 mg   3 mg Oral Nightly PRN    polyethylene glycol (GLYCOLAX) packet 17 g  17 g Oral Daily PRN    acetaminophen (TYLENOL) tablet 650 mg  650 mg Oral Q6H PRN    Or    acetaminophen (TYLENOL) suppository 650 mg  650 mg Rectal Q6H PRN         Objective:      Physical Exam  Physical Exam  Constitutional:       General: He is not in acute distress.  HENT:      Head: Normocephalic and atraumatic.   Neck:      Vascular: No JVD.   Cardiovascular:      Rate and Rhythm: Normal rate and regular rhythm.   Pulmonary:      Effort: Pulmonary effort is normal.      Breath sounds: No wheezing.   Skin:     General: Skin is warm.   Neurological:      Mental Status: He is alert.            Data Review:   Recent Labs  02/08/24  0334 02/09/24  0505   WBC 3.0* 6.0   HGB 12.9 13.1   HCT 38.1 40.0   PLT 262 254     Recent Labs     02/08/24  0334 02/09/24  0505   NA 136 137   K 3.9 3.6   CL 106 105   CO2 24 25   BUN 16 13   CREATININE 0.80 0.94   MG 2.0 2.2   ALT 18 16       Intake/Output Summary (Last 24 hours) at 02/10/2024 1701  Last data filed at 02/10/2024 4696  Gross per 24 hour   Intake --   Output 1000 ml   Net -1000 ml      Assessment:     Principal Problem:    Acute respiratory failure with hypoxia (HCC)  Active Problems:    Heart palpitations    Exertional dyspnea    Acute on chronic low back pain  Resolved Problems:    * No resolved hospital problems. *      Plan:     Patients shortness of breath improved with steroid therapy. Currently feeling better, okay for outpatient follow up from cardiology standpoint. Echo unremarkable. Recommended low sodium diet to the patient.     Thank you for allowing us  to participate in the care of this patient.  Feel free to reach back out if we could be of any further assistance.    Jeanette Moffatt Anders Ban, MD

## 2024-02-10 NOTE — Plan of Care (Signed)
 Problem: Discharge Planning  Goal: Discharge to home or other facility with appropriate resources  Outcome: Progressing     Problem: Pain  Goal: Verbalizes/displays adequate comfort level or baseline comfort level  Outcome: Progressing     Problem: Safety - Adult  Goal: Free from fall injury  Outcome: Progressing     Problem: Respiratory - Adult  Goal: Achieves optimal ventilation and oxygenation  02/10/2024 0806 by Sundra Engel, RN  Outcome: Progressing  02/09/2024 2233 by Earla Glassman R, RT  Outcome: Progressing

## 2024-02-10 NOTE — Progress Notes (Signed)
 Attempted to schedule PCP hospital follow up. Patient doesn't have a PCP documented on the chart at this time. Pending patient discharge.

## 2024-02-10 NOTE — Progress Notes (Signed)
 End of Shift Note    Bedside shift change report given to [facility] RN (oncoming nurse) by Garnell Jurist, RN (offgoing nurse).  Report included the following information SBAR, Kardex, MAR, Recent Results, and Quality Measures    Shift worked:  1900-0700     Shift summary and any significant changes:     Seen patient up in chair, not in distress. No complain of pain. Due medications given. Hourly rounding done. Patient needs attended. Plan of care ongoing.     Concerns for physician to address:  none     Zone phone for oncoming shift:   [phone]       Activity:  Level of Assistance: Minimal assist, patient does 75% or more  Number times ambulated in hallways past shift: 0  Number of times OOB to chair past shift: 1    Cardiac:   Cardiac Monitoring: Yes      Cardiac Rhythm: Junctional rhythm    Access:  Current line(s): PIV     Genitourinary:        Respiratory:   O2 Device: None (Room air)  Chronic home O2 use?: NO  Incentive spirometer at bedside: NO    GI:  Last BM (including prior to admit): 02/05/24  Current diet:  ADULT DIET; Regular  Passing flatus: YES    Pain Management:   Patient states pain is manageable on current regimen: YES    Skin:  Braden Scale Score: 20  Interventions: Wound Offloading (Prevention Methods): Pillows, Repositioning, Turning    Patient Safety:  Fall Risk: Nursing Judgement-Fall Risk High(Add Comments): Yes  Fall Risk Interventions  Nursing Judgement-Fall Risk High(Add Comments): Yes  Toilet Every 2 Hours-In Advance of Need: Yes  Hourly Visual Checks: Eyes closed, Quiet  Fall Visual Posted: Fall sign posted, Socks  Room Door Open: Yes  Alarm On: Bed  Patient Moved Closer to Nursing Station: No    Active Consults:   IP CONSULT TO HOSPITALIST  IP CONSULT TO SOCIAL WORK  IP CONSULT TO PULMONOLOGY  IP CONSULT TO CARDIOLOGY    Length of Stay:  Expected LOS: 3  Actual LOS: 3    Von Wilbur Handing, RN

## 2024-02-10 NOTE — Progress Notes (Signed)
 Pulmonary, Critical Care, and Sleep Medicine    Initial Patient Progress    Name: Ronald Kennedy MRN: [id number]   DOB: 13-Dec-1960 Hospital: [facility]   Date: 02/10/2024        IMPRESSION:   Exertional hypoxemia of uncertain etiology, may be chronic (based on notes from 2021)  Has had some responsiveness to bronchodilators, so could be obstructive lung disease  Can not exclude cardiac causes or PAH  Echo EF 55-60%   H/O PE, reports he completed treatment  DJD  Cigar smoker, quit 6 months ago      RECOMMENDATIONS:   O2, wean as tolerated.   Exercise oximetry with no oxygen requirement   Bronchodilators   DVT prophylaxis    Noted plans for dc today and albuterol  inhaler already sent. Will arrange outpatient follow up with our office for PFTs.      Subjective:     5/27 Pt seen this afternoon sitting in chair. States he is feeling better. Denies significant cough or dyspnea. On RA.     This patient has been seen and evaluated at the request of Dr. Karole Pacer for hypoxia. Patient is a 63 y.o. male former smoker (cigars for years, quit 6 months ago), presented for evaluation of several months of dyspnea on exertion.  He has had shortness of breath over that time with associated palpitations and lightheadedness.  No cough or sputum production.  Found to be hypoxic with exertion.  He has a h/o PE in 2021, and notes indicate that he had hypoxia with exertion then, but he was not sent home with O2 at that time.  He denies lower extremity edema.       Past Medical History:   Diagnosis Date    Alcohol use with alcohol-induced mood disorder (HCC) 02/07/2024    Degenerative joint disease of both hips     Hypertension     Multilevel degenerative disc disease     PE (pulmonary thromboembolism) (HCC) 2022    DVT/PE - treated with eliquis for 3 mos      Past Surgical History:   Procedure Laterality Date    ORTHOPEDIC SURGERY      left knee for torn ligament      Prior to Admission medications    Medication Sig Start  Date End Date Taking? Authorizing Provider   albuterol  sulfate HFA (VENTOLIN  HFA) 108 (90 Base) MCG/ACT inhaler Inhale 2 puffs into the lungs 4 times daily as needed for Wheezing 02/05/24  Yes Venditti, Caesar Caster, MD   naproxen  (NAPROSYN ) 500 MG tablet Take 1 tablet by mouth 2 times daily 12/20/23  Yes Margy Shin, MD   predniSONE  10 MG (21) TBPK Follow instructions on package 05/04/23  Yes Danetta Dunnings, APRN - NP   diclofenac  sodium (VOLTAREN ) 1 % GEL Apply 4 g topically 4 times daily as needed for Pain 05/04/23  Yes Danetta Dunnings, APRN - NP     No Known Allergies   Social History     Tobacco Use    Smoking status: Never    Smokeless tobacco: Never   Substance Use Topics    Alcohol use: Yes      History reviewed. No pertinent family history.     @CMEDSCH @    Review of Systems:  Pertinent items are noted in HPI.    Objective:   Vital Signs:    BP (!) 164/96   Pulse 63   Temp 97.4 F (36.3 C) (Oral)   Resp  16   Ht 1.854 m (6\' 1" )   Wt 104.3 kg (230 lb)   SpO2 99%   BMI 30.34 kg/m             Temp (24hrs), Avg:98.1 F (36.7 C), Min:97.4 F (36.3 C), Max:98.4 F (36.9 C)       Intake/Output:   Last shift:      No intake/output data recorded.  Last 3 shifts: 05/25 1901 - 05/27 0700  In: -   Out: 2025 [Urine:2025]    Intake/Output Summary (Last 24 hours) at 02/10/2024 1418  Last data filed at 02/10/2024 4010  Gross per 24 hour   Intake --   Output 1000 ml   Net -1000 ml      Physical Exam:   General:  Alert, cooperative, no distress, appears stated age.   Head:  Normocephalic, atraumatic.                       Lungs:   Clear to auscultation bilaterally.   Chest wall:  No tenderness or deformity.   Heart:  Regular rate and rhythm    Abdomen:   Soft, non-tender.    Extremities: Extremities normal, atraumatic,    Skin: Skin color, texture, turgor normal.        Neurologic: Grossly nonfocal     Data review:     Recent Results (from the past 24 hours)   Echo (TTE) complete (PRN contrast/bubble/strain/3D)     Collection Time: 02/10/24  9:27 AM   Result Value Ref Range    LVIDs 1.8 cm    LVOT Diameter 1.9 cm    LV Ejection Fraction A4C 44 %    LV EDV A4C 119 mL    LV ESV A4C 67 mL    LVOT Peak Gradient 8 mmHg    LVOT Mean Gradient 4 mmHg    LVOT SV 85.6 ml    LVOT Peak Velocity 1.4 m/s    LVOT VTI 30.2 cm    RVIDd 3.7 cm    RV Free Wall Peak S' 12.2 cm/s    LA Diameter 3.7 cm    AV Area by Peak Velocity 2.8 cm2    AV Area by VTI 2.7 cm2    AV Peak Gradient 8 mmHg    AV Mean Gradient 5 mmHg    AV Peak Velocity 1.4 m/s    AV Mean Velocity 1.0 m/s    AV VTI 32.3 cm    MV A Velocity 0.74 m/s    MV E Wave Deceleration Time 237.5 ms    MV E Velocity 0.77 m/s    LV E' Lateral Velocity 9.23 cm/s    LV E' Septal Velocity 6.47 cm/s    MR Peak Velocity 3.0 m/s    MR Peak Velocity 4.9 m/s    MR VTI 119.7 cm    PV Peak Gradient 4 mmHg    PV Max Velocity 1.0 m/s    TAPSE 2.4 >=1.7 cm    TR Peak Gradient 13 mmHg    TR Max Velocity 1.78 m/s    Ascending Aorta 4.0 cm    Aortic Root 4.1 cm    Body Surface Area 2.32 m2    LV ESV Index A4C 29 mL/m2    LV EDV Index A4C 52 mL/m2    LVIDs Index 0.79 cm/m2    MV E/A 1.04     E/E' Ratio (Averaged) 10.12     E/E' Lateral 8.34  E/E' Septal 11.90     LVOT Stroke Volume Index 37.5 mL/m2    LVOT Area 2.8 cm2    LA Size Index 1.62 cm/m2    LA/AO Root Ratio 0.90     Ao Root Index 1.80 cm/m2    Ascending Aorta Index 1.75 cm/m2    AV Velocity Ratio 1.00     LVOT:AV VTI Index 0.93     AVA/BSA VTI 1.2 cm2/m2    AVA/BSA Peak Velocity 1.2 cm2/m2    EF Physician 55 %       Imaging:  I have personally reviewed the patient's radiographs and have reviewed the reports:  No acute process.  Chronic high density material in the lung bases, present since at least 2017 (oldest available image)        Michalene Agee, PA-C

## 2024-02-10 NOTE — Progress Notes (Signed)
 Spoke to MD Yousif.  Patient is now clear for discharge.

## 2024-02-10 NOTE — Progress Notes (Signed)
 ADULT PROTOCOL: JET AEROSOL ASSESSMENT    Patient  Ronald Kennedy     63 y.o.   male     02/10/2024  8:12 AM    Breath Sounds Pre Procedure: Breath Sounds Pre-Tx LUL: Clear                                  Breath Sounds Pre-Tx LLL: Clear        Breath Sounds Pre-Tx RUL: Clear        Breath Sounds Pre-Tx RML: Clear        Breath Sounds Pre-Tx RLL: Clear  Breath Sounds Post Procedure: Breath Sounds Post-Tx LUL: Clear          Breath Sounds Post-Tx LLL: Clear          Breath Sounds Post-Tx RUL: Clear          Breath Sounds Post-Tx RML: Clear          Breath Sounds Post-Tx RLL: Clear                                     Heart Rate: Pre procedure Pre-Tx Pulse: 57           Post procedure Post-Tx Pulse: 60    Resp Rate: Pre procedure Pre-Tx Resps: 18           Post procedure Post-Tx Resps: 18    Oxygen: O2 Therapy: Room air        Changed: No    SpO2:  SpO2: 99 %   without Oxygen                Nebulizer Therapy: Current medications Medications: Albuterol /Ipratropium      Changed: No        Problem List:   Patient Active Problem List   Diagnosis    NSTEMI (non-ST elevated myocardial infarction) (HCC)    Heart palpitations    Exertional dyspnea    Acute respiratory failure with hypoxia (HCC)    Acute on chronic low back pain       Respiratory Therapist: Gurleen Larrivee, RT

## 2024-02-10 NOTE — Progress Notes (Signed)
Pulse oximetry assessment   96% at rest on room air (if 88% or less, skip next steps)  93% while ambulating on room air

## 2024-05-15 ENCOUNTER — Inpatient Hospital Stay
Admit: 2024-05-15 | Discharge: 2024-05-15 | Disposition: A | Discharge status: Home / Self Care | Payer: Medicare (Managed Care) | Arrived: AM

## 2024-05-15 DIAGNOSIS — G8929 Other chronic pain: Principal | ICD-10-CM

## 2024-05-15 DIAGNOSIS — M545 Low back pain, unspecified: Secondary | ICD-10-CM

## 2024-05-15 MED ORDER — KETOROLAC TROMETHAMINE 30 MG/ML IJ SOLN
30 | INTRAMUSCULAR | Status: AC
Start: 2024-05-15 — End: 2024-05-15
  Administered 2024-05-15: 20:00:00 15 mg via INTRAMUSCULAR

## 2024-05-15 MED ORDER — LIDOCAINE 5 % EX PTCH
5 | MEDICATED_PATCH | Freq: Every day | CUTANEOUS | 0 refills | Status: AC
Start: 2024-05-15 — End: 2024-05-25

## 2024-05-15 MED FILL — KETOROLAC TROMETHAMINE 30 MG/ML IJ SOLN: 30 mg/mL | INTRAMUSCULAR | Qty: 1

## 2024-05-15 NOTE — ED Notes (Signed)
 Patient wheeled to waiting room for his medicaid ride that was set up

## 2024-05-15 NOTE — Discharge Instructions (Addendum)
 Follow-up with spine specialist and PCP   Physical therapy   Return precautions as we discussed     Thank You!    It was a pleasure taking care of you in our Emergency Department today. We know that when you come to [facility], you are entrusting us  with your health, comfort, and safety. Our clinicians honor that trust, and truly appreciate the opportunity to care for you and your loved ones.    If you receive a survey about your Emergency Department experience today, please fill it out.  We value your feedback. Thank you.      Waddell Earthly PA-C    ___________________________________  I have included a copy of your lab results and/or radiologic studies from today's visit so you can have them easily available at your follow-up visit.   No results found for this or any previous visit (from the past 12 hours).    No orders to display     @CT48 @

## 2024-05-15 NOTE — ED Provider Notes (Cosign Needed)
 [facility]  EMERGENCY DEPARTMENT ENCOUNTER       Pt Name: Ronald Kennedy  MRN: [id number]  Birthdate 02-Aug-1961  Date of evaluation: 05/15/2024  Provider: Waddell Earthly, PA   PCP: No primary care provider on file.  Note Started: 3:52 PM EDT 05/15/24     CHIEF COMPLAINT       Chief Complaint   Patient presents with    Back Pain     Patient ambulatory to triage with c/o lower left back pain that started 15-20 mins ago. Patient denies any urinary issues or problems having BM. No other complaints per patient.         HISTORY OF PRESENT ILLNESS: 1 or more elements      History From: Patient  None     Ronald Kennedy is a 63 y.o. male with history as noted below, who presents to the ED for evaluation of left low back pain.  Patient states this is an acute on chronic issue for him, states this particular episode has been ongoing the past day.  States pain is achy spasming pain low back is worse with certain movements.  States pain is characteristic of prior pain flares.  States he has not had any new trauma or injuries. Denies any bowel or bladder incontinence/retention, or saddle anesthesias.  Denies any new extremity numbness, or weakness.  Denies any urinary symptoms.  Denies any fevers, chest pain, shortness of breath, abdominal pain, nausea, vomiting, diarrhea, blood in urine or stool, changes in bowel or bladder habits.       Nursing Notes were all reviewed and agreed with or any disagreements were addressed in the HPI.     REVIEW OF SYSTEMS      Review of Systems   All other systems reviewed and are negative.       Positives and Pertinent negatives as per HPI.    PAST HISTORY     Past Medical History:  Past Medical History:   Diagnosis Date    Alcohol use with alcohol-induced mood disorder (HCC) 02/07/2024    Degenerative joint disease of both hips     Hypertension     Multilevel degenerative disc disease     PE (pulmonary thromboembolism) (HCC) 2022    DVT/PE - treated with eliquis for 3 mos        Past Surgical History:  Past Surgical History:   Procedure Laterality Date    ORTHOPEDIC SURGERY      left knee for torn ligament       Family History:  History reviewed. No pertinent family history.    Social History:  Social History     Tobacco Use    Smoking status: Never    Smokeless tobacco: Never   Substance Use Topics    Alcohol use: Yes    Drug use: Never       Allergies:  No Known Allergies        PHYSICAL EXAM      ED Triage Vitals   Encounter Vitals Group      BP 05/15/24 1505 (!) 144/81      Girls Systolic BP Percentile --       Girls Diastolic BP Percentile --       Boys Systolic BP Percentile --       Boys Diastolic BP Percentile --       Pulse 05/15/24 1505 85      Respirations 05/15/24 1545 16      Temp 05/15/24  1505 97.9 F (36.6 C)      Temp src --       SpO2 05/15/24 1505 98 %      Weight - Scale 05/15/24 1503 90.7 kg (200 lb)      Height --       Head Circumference --       Peak Flow --       Pain Score --       Pain Loc --       Pain Education --       Exclude from Growth Chart --         Physical Exam  Constitutional:       Appearance: Normal appearance.   HENT:      Head: Normocephalic and atraumatic.      Mouth/Throat:      Mouth: Mucous membranes are moist.   Eyes:      Conjunctiva/sclera: Conjunctivae normal.   Cardiovascular:      Rate and Rhythm: Normal rate and regular rhythm.   Pulmonary:      Effort: Pulmonary effort is normal.      Breath sounds: Normal breath sounds.   Abdominal:      General: There is no distension.      Tenderness: There is no abdominal tenderness. There is no guarding or rebound.   Musculoskeletal:         General: Normal range of motion.      Cervical back: Normal range of motion.      Comments: TTP to left lumbosacral paraspinal muscles.  No midline tenderness.  No palpable deformities.  Neurovascularly intact distally.  Strong DP and PT pulses.   Skin:     General: Skin is warm and dry.   Neurological:      General: No focal deficit present.      Mental  Status: He is alert. Mental status is at baseline.   Psychiatric:         Mood and Affect: Mood normal.         Behavior: Behavior normal.          DIAGNOSTIC RESULTS   LABS:     No results found for this or any previous visit (from the past 24 hours).    EKG: When ordered, EKG's are interpreted by the Emergency Department Physician in the absence of a cardiologist.  Please see their note for interpretation of EKG.      RADIOLOGY:  Non-plain film images such as CT, Ultrasound and MRI are read by the radiologist. Plain radiographic images are visualized and preliminarily interpreted by the ED Provider with the below findings:          Interpretation per the Radiologist below, if available at the time of this note:     No orders to display        PROCEDURES   Unless otherwise noted below, none  Procedures     CRITICAL CARE TIME       EMERGENCY DEPARTMENT COURSE and DIFFERENTIAL DIAGNOSIS/MDM   Vitals:    Vitals:    05/15/24 1503 05/15/24 1505 05/15/24 1545   BP:  (!) 144/81    Pulse:  85    Resp:   16   Temp:  97.9 F (36.6 C)    SpO2:  98%    Weight: 90.7 kg (200 lb)          Patient was given the following medications:  Medications   ketorolac  (TORADOL ) injection 15  mg (15 mg IntraMUSCular Given 05/15/24 1614)       CONSULTS: (Who and What was discussed)  None    Chronic Conditions: As noted above    Records Reviewed (source and summary of external records): Nursing Notes, Old Medical Records, Previous Radiology Studies, and Previous Laboratory Studies    MDM (CC/HPI Summary, DDx, ED Course, Reassessment, Disposition Considerations -Tests not done, Shared Decision Making, Pt Expectation of Test or Tx.):      Patient is a 63 year old male with history as noted below, who presents to the ED for evaluation of left low back pain.  Patient states this is an acute on chronic issue for him, states this particular episode has been ongoing the past day.  States pain is achy spasming pain low back is worse with certain  movements.  States pain is characteristic of prior pain flares.  States he has not had any new trauma or injuries. Denies any bowel or bladder incontinence/retention, or saddle anesthesias.  Denies any new extremity numbness, or weakness.  Denies any urinary symptoms.  Denies any fevers, chest pain, shortness of breath, abdominal pain, nausea, vomiting, diarrhea, blood in urine or stool, changes in bowel or bladder habits.    Patient states this is an ongoing issue for him.  He denies any changes today.    No red flags.  See ED course note below for additional MDM       ED Course as of 05/17/24 1941   Sat May 15, 2024   1546 Record review, patient was seen 08/09/23 for back pain    EXAM:  CT LUMBAR SPINE WO CONTRAST  FINDINGS:  There are 6 lumbar-type nonrib-bearing vertebra segments, consistent with  completely lumbarized S1 transitional lumbosacral segment. Last well-formed disc  is numbered L6-S1 for descriptive purposes in this report and vertebral segments  numbered accordingly.  No acute displaced fractures. The vertebral body heights are preserved. No  significant vertebral listhesis.  Multilevel degenerative disc changes.  T12-L1: The spinal canal and neural foramina are widely patent.  L1-L2:   The spinal canal and neural foramina are widely patent.  L2-L3:   No significant disc protrusion, spinal canal or neuroforaminal  narrowing.  L3-L4:   Mild posterior disc narrowing and posterior disc bulge with moderate  facet arthrosis and ligamentum flavum thickening, contributing to mild  spinal  canal narrowing. Mild bilateral neural foraminal narrowing.  L4-5:   Mild posterior disc narrowing and posterior disc bulge with moderate  facet arthrosis and ligamentum flavum thickening, contributing to mild  spinal  canal narrowing. Mild bilateral neural foraminal narrowing.  L5-L6:   Posterior disc narrowing and posterior disc bulge with moderate facet  arthrosis and ligamentum flavum thickening, contributing to mild   spinal canal  narrowing. Mild to moderate left and mild right neural foraminal narrowing.  L6-S1:   Posterior disc narrowing and posterior disc bulge with moderate facet  arthrosis and ligamentum flavum thickening, contributing to mild  spinal canal  narrowing. Mild bilateral neural foraminal narrowing.  Visualized soft tissues are unremarkable. SABRA  IMPRESSION:  Multilevel spondylosis without high-grade spinal canal or neuroforaminal  narrowing.  Additional findings suggest transitional lumbosacral segment as detailed.  No acute osseous abnormality or malalignment.     Electronically signed by KATHALENE CHRIST   [TL]   [phone] Shared decision making performed and care plan created together, patient denies any new trauma or injuries, states this has been an ongoing issue for him.  States he has not yet followed  up with a back specialist as he was never told that he wants to go to.  States he would just like something for his symptoms at this time.  He defers further workup and imaging.  Given symptoms are characteristic of prior back pain flares and he denies any red flags or new trauma or injuries I think this is reasonable.  Patient declines further labs and imaging.    Will give a dose of IM Toradol .   [TL]      ED Course User Index  [TL] Guy Birmingham, PA       Provided contact for spine/neurosurgical specialist.  Counseled additional symptomatic management techniques.  PCP follow-up.  Verbal return precautions advised.  Patient is happy with this plan and verbalizes understanding and agreement.        No evidence of emergent conditions requiring further evaluation or management acutely here at this time.       FINAL IMPRESSION     1. Acute exacerbation of chronic low back pain          DISPOSITION/PLAN   DISPOSITION Decision To Discharge 05/15/2024 03:49:12 PM   DISPOSITION CONDITION Stable           Discharge Note: The patient is stable for discharge home. The signs, symptoms, diagnosis, and discharge  instructions have been discussed, understanding conveyed, and agreed upon. The patient is to follow up as recommended or return to ER should their symptoms worsen.      PATIENT REFERRED TO:  [facility] Emergency Department  [address]  [place]  [address]  [phone]    As needed, If symptoms worsen    your primary care provider  follow-up with your PCP for re-evaluation and follow-up from todays ED visit        [facility]  [address]  [address]  [place]  [address]  Schedule an appointment as soon as possible for a visit       [facility]  [address] [place] [address]  [place]  [address]  [phone]  Schedule an appointment as soon as possible for a visit       Marshel Lauree LABOR, MD  [address]  [address]  [place] [address]  [phone]              DISCHARGE MEDICATIONS:     Medication List        START taking these medications      lidocaine  5 %  Commonly known as: LIDODERM   Place 1 patch onto the skin daily for 10 days 12 hours on, 12 hours off.            ASK your doctor about these medications      albuterol  sulfate HFA 108 (90 Base) MCG/ACT inhaler  Commonly known as: Ventolin  HFA  Inhale 2 puffs into the lungs 4 times daily as needed for Wheezing     diclofenac  sodium 1 % Gel  Commonly known as: VOLTAREN   Apply 4 g topically 4 times daily as needed for Pain     naproxen  500 MG tablet  Commonly known as: Naprosyn   Take 1 tablet by mouth 2 times daily     predniSONE  10 MG (21) Tbpk  Follow instructions on package               Where to Get Your Medications        These medications were sent to [id number] [place], [place] - [phone]  [phone] [place] [phone]  [address] [place] [address]      Phone: [phone]   lidocaine  5 %           DISCONTINUED MEDICATIONS:  Discharge Medication List as of 05/15/2024  4:14 PM              I am the Primary Clinician of Record.   Waddell Earthly, PA (electronically signed)    (Please note that parts of this dictation were completed with voice recognition software. Quite often unanticipated grammatical, syntax, homophones, and other interpretive errors are inadvertently transcribed by the computer software. Please disregards these errors. Please excuse any errors that have escaped final proofreading.)         Earthly Waddell, [place]  05/17/24 1944
